# Patient Record
Sex: Female | Born: 1935 | Hispanic: No | Smoking: Former smoker
Health system: Southern US, Community
[De-identification: ages and names within clinical notes are randomized; demographics above are authoritative.]

## PROBLEM LIST (undated history)

## (undated) DIAGNOSIS — L309 Dermatitis, unspecified: Secondary | ICD-10-CM

## (undated) DIAGNOSIS — E039 Hypothyroidism, unspecified: Secondary | ICD-10-CM

## (undated) DIAGNOSIS — E559 Vitamin D deficiency, unspecified: Secondary | ICD-10-CM

## (undated) DIAGNOSIS — N200 Calculus of kidney: Secondary | ICD-10-CM

## (undated) DIAGNOSIS — H60542 Acute eczematoid otitis externa, left ear: Secondary | ICD-10-CM

## (undated) DIAGNOSIS — N179 Acute kidney failure, unspecified: Secondary | ICD-10-CM

## (undated) DIAGNOSIS — R569 Unspecified convulsions: Secondary | ICD-10-CM

## (undated) DIAGNOSIS — G4700 Insomnia, unspecified: Secondary | ICD-10-CM

## (undated) DIAGNOSIS — G629 Polyneuropathy, unspecified: Secondary | ICD-10-CM

## (undated) DIAGNOSIS — J309 Allergic rhinitis, unspecified: Secondary | ICD-10-CM

## (undated) DIAGNOSIS — M1990 Unspecified osteoarthritis, unspecified site: Secondary | ICD-10-CM

## (undated) DIAGNOSIS — I4891 Unspecified atrial fibrillation: Secondary | ICD-10-CM

## (undated) DIAGNOSIS — A6004 Herpesviral vulvovaginitis: Secondary | ICD-10-CM

## (undated) DIAGNOSIS — D649 Anemia, unspecified: Secondary | ICD-10-CM

## (undated) DIAGNOSIS — I10 Essential (primary) hypertension: Secondary | ICD-10-CM

## (undated) DIAGNOSIS — Z87442 Personal history of urinary calculi: Secondary | ICD-10-CM

## (undated) HISTORY — DX: Anemia, unspecified: D64.9

## (undated) HISTORY — DX: Insomnia, unspecified: G47.00

## (undated) HISTORY — DX: Acute kidney failure, unspecified: N17.9

## (undated) HISTORY — DX: Unspecified osteoarthritis, unspecified site: M19.90

## (undated) HISTORY — DX: Personal history of urinary calculi: Z87.442

## (undated) HISTORY — DX: Hypothyroidism, unspecified: E03.9

## (undated) HISTORY — DX: Polyneuropathy, unspecified: G62.9

## (undated) HISTORY — DX: Vitamin D deficiency, unspecified: E55.9

## (undated) HISTORY — PX: OTHER SURGICAL HISTORY: SHX169

## (undated) HISTORY — DX: Herpesviral vulvovaginitis: A60.04

## (undated) HISTORY — DX: Acute eczematoid otitis externa, left ear: H60.542

## (undated) HISTORY — DX: Dermatitis, unspecified: L30.9

## (undated) HISTORY — DX: Allergic rhinitis, unspecified: J30.9

---

## 2013-10-17 DIAGNOSIS — R569 Unspecified convulsions: Secondary | ICD-10-CM | POA: Insufficient documentation

## 2016-07-29 ENCOUNTER — Encounter: Payer: Self-pay | Admitting: Internal Medicine

## 2016-07-29 ENCOUNTER — Observation Stay
Admission: EM | Admit: 2016-07-29 | Discharge: 2016-07-30 | Disposition: A | Discharge status: Home / Self Care | Payer: Medicare Other | Attending: Specialist | Admitting: Specialist

## 2016-07-29 ENCOUNTER — Emergency Department: Payer: Medicare Other

## 2016-07-29 DIAGNOSIS — R7989 Other specified abnormal findings of blood chemistry: Secondary | ICD-10-CM

## 2016-07-29 DIAGNOSIS — R079 Chest pain, unspecified: Principal | ICD-10-CM | POA: Diagnosis present

## 2016-07-29 DIAGNOSIS — G629 Polyneuropathy, unspecified: Secondary | ICD-10-CM | POA: Insufficient documentation

## 2016-07-29 DIAGNOSIS — G40909 Epilepsy, unspecified, not intractable, without status epilepticus: Secondary | ICD-10-CM | POA: Insufficient documentation

## 2016-07-29 DIAGNOSIS — Z87442 Personal history of urinary calculi: Secondary | ICD-10-CM | POA: Insufficient documentation

## 2016-07-29 DIAGNOSIS — I69392 Facial weakness following cerebral infarction: Secondary | ICD-10-CM | POA: Insufficient documentation

## 2016-07-29 DIAGNOSIS — E039 Hypothyroidism, unspecified: Secondary | ICD-10-CM | POA: Insufficient documentation

## 2016-07-29 DIAGNOSIS — I5031 Acute diastolic (congestive) heart failure: Secondary | ICD-10-CM | POA: Insufficient documentation

## 2016-07-29 DIAGNOSIS — N179 Acute kidney failure, unspecified: Secondary | ICD-10-CM | POA: Insufficient documentation

## 2016-07-29 DIAGNOSIS — Z79899 Other long term (current) drug therapy: Secondary | ICD-10-CM | POA: Insufficient documentation

## 2016-07-29 DIAGNOSIS — R9431 Abnormal electrocardiogram [ECG] [EKG]: Secondary | ICD-10-CM | POA: Diagnosis not present

## 2016-07-29 DIAGNOSIS — I4891 Unspecified atrial fibrillation: Secondary | ICD-10-CM | POA: Insufficient documentation

## 2016-07-29 DIAGNOSIS — I200 Unstable angina: Secondary | ICD-10-CM

## 2016-07-29 DIAGNOSIS — R778 Other specified abnormalities of plasma proteins: Secondary | ICD-10-CM | POA: Diagnosis not present

## 2016-07-29 DIAGNOSIS — I110 Hypertensive heart disease with heart failure: Secondary | ICD-10-CM | POA: Diagnosis not present

## 2016-07-29 DIAGNOSIS — Z87891 Personal history of nicotine dependence: Secondary | ICD-10-CM | POA: Insufficient documentation

## 2016-07-29 HISTORY — DX: Calculus of kidney: N20.0

## 2016-07-29 HISTORY — DX: Unspecified atrial fibrillation: I48.91

## 2016-07-29 HISTORY — DX: Unspecified convulsions: R56.9

## 2016-07-29 HISTORY — DX: Acute kidney failure, unspecified: N17.9

## 2016-07-29 LAB — CBC WITH DIFFERENTIAL/PLATELET
Basophils Absolute: 0 10*3/uL (ref 0–0.1)
Basophils Relative: 1 %
EOS ABS: 0 10*3/uL (ref 0–0.7)
EOS PCT: 1 %
HCT: 31.1 % — ABNORMAL LOW (ref 35.0–47.0)
Hemoglobin: 10.2 g/dL — ABNORMAL LOW (ref 12.0–16.0)
LYMPHS ABS: 0.7 10*3/uL — AB (ref 1.0–3.6)
Lymphocytes Relative: 19 %
MCH: 30.9 pg (ref 26.0–34.0)
MCHC: 32.9 g/dL (ref 32.0–36.0)
MCV: 94 fL (ref 80.0–100.0)
MONO ABS: 0.4 10*3/uL (ref 0.2–0.9)
Monocytes Relative: 10 %
Neutro Abs: 2.7 10*3/uL (ref 1.4–6.5)
Neutrophils Relative %: 69 %
PLATELETS: 92 10*3/uL — AB (ref 150–440)
RBC: 3.31 MIL/uL — AB (ref 3.80–5.20)
RDW: 17.9 % — AB (ref 11.5–14.5)
WBC: 3.8 10*3/uL (ref 3.6–11.0)

## 2016-07-29 LAB — COMPREHENSIVE METABOLIC PANEL
ALK PHOS: 55 U/L (ref 38–126)
ALT: 51 U/L (ref 14–54)
AST: 44 U/L — ABNORMAL HIGH (ref 15–41)
Albumin: 3.6 g/dL (ref 3.5–5.0)
Anion gap: 10 (ref 5–15)
BUN: 17 mg/dL (ref 6–20)
CALCIUM: 8.9 mg/dL (ref 8.9–10.3)
CO2: 24 mmol/L (ref 22–32)
CREATININE: 0.72 mg/dL (ref 0.44–1.00)
Chloride: 104 mmol/L (ref 101–111)
Glucose, Bld: 140 mg/dL — ABNORMAL HIGH (ref 65–99)
Potassium: 3.5 mmol/L (ref 3.5–5.1)
Sodium: 138 mmol/L (ref 135–145)
Total Bilirubin: 1.2 mg/dL (ref 0.3–1.2)
Total Protein: 7.1 g/dL (ref 6.5–8.1)

## 2016-07-29 LAB — TROPONIN I
TROPONIN I: 0.04 ng/mL — AB (ref <0.03)
TROPONIN I: 0.05 ng/mL — AB (ref <0.03)

## 2016-07-29 MED ORDER — HYDRALAZINE HCL 20 MG/ML IJ SOLN
10.0000 mg | Freq: Once | INTRAMUSCULAR | Status: AC
  Administered 2016-07-29: 10 mg via INTRAVENOUS

## 2016-07-29 MED ORDER — NITROGLYCERIN 2 % TD OINT
TOPICAL_OINTMENT | TRANSDERMAL | Status: AC
  Administered 2016-07-29: 0.5 [in_us] via TOPICAL
  Filled 2016-07-29: qty 1

## 2016-07-29 MED ORDER — ACETAMINOPHEN 325 MG PO TABS: 650.0000 mg | ORAL_TABLET | Freq: Four times a day (QID) | ORAL | Status: DC | PRN

## 2016-07-29 MED ORDER — NITROGLYCERIN 2 % TD OINT
0.5000 [in_us] | TOPICAL_OINTMENT | Freq: Once | TRANSDERMAL | Status: AC
  Administered 2016-07-29: 0.5 [in_us] via TOPICAL

## 2016-07-29 MED ORDER — FUROSEMIDE 10 MG/ML IJ SOLN
INTRAMUSCULAR | Status: AC
  Administered 2016-07-29: 10 mg via INTRAVENOUS
  Filled 2016-07-29: qty 4

## 2016-07-29 MED ORDER — ASPIRIN 81 MG PO CHEW
324.0000 mg | CHEWABLE_TABLET | Freq: Once | ORAL | Status: AC
  Administered 2016-07-29: 324 mg via ORAL
  Filled 2016-07-29: qty 4

## 2016-07-29 MED ORDER — IBUPROFEN 400 MG PO TABS: 400.0000 mg | ORAL_TABLET | Freq: Four times a day (QID) | ORAL | Status: DC | PRN

## 2016-07-29 MED ORDER — LOSARTAN POTASSIUM 50 MG PO TABS
50.0000 mg | ORAL_TABLET | Freq: Every day | ORAL | Status: DC
  Administered 2016-07-30: 50 mg via ORAL
  Filled 2016-07-29: qty 1

## 2016-07-29 MED ORDER — SODIUM CHLORIDE 0.9% FLUSH: 3.0000 mL | INTRAVENOUS | Status: DC | PRN

## 2016-07-29 MED ORDER — ACETAMINOPHEN 650 MG RE SUPP: 650.0000 mg | Freq: Four times a day (QID) | RECTAL | Status: DC | PRN

## 2016-07-29 MED ORDER — HYDRALAZINE HCL 20 MG/ML IJ SOLN
INTRAMUSCULAR | Status: AC
  Administered 2016-07-29: 10 mg via INTRAVENOUS
  Filled 2016-07-29: qty 1

## 2016-07-29 MED ORDER — FUROSEMIDE 10 MG/ML IJ SOLN
20.0000 mg | Freq: Two times a day (BID) | INTRAMUSCULAR | Status: DC
  Administered 2016-07-29 – 2016-07-30 (×2): 20 mg via INTRAVENOUS
  Filled 2016-07-29 (×2): qty 2

## 2016-07-29 MED ORDER — ENOXAPARIN SODIUM 40 MG/0.4ML ~~LOC~~ SOLN
40.0000 mg | SUBCUTANEOUS | Status: DC
  Administered 2016-07-29: 40 mg via SUBCUTANEOUS
  Filled 2016-07-29: qty 0.4

## 2016-07-29 MED ORDER — GABAPENTIN 100 MG PO CAPS
100.0000 mg | ORAL_CAPSULE | Freq: Two times a day (BID) | ORAL | Status: DC
Start: 2016-07-29 — End: 2016-07-30
  Administered 2016-07-29 – 2016-07-30 (×2): 100 mg via ORAL
  Filled 2016-07-29 (×2): qty 1

## 2016-07-29 MED ORDER — HYDRALAZINE HCL 20 MG/ML IJ SOLN: 10.0000 mg | Freq: Four times a day (QID) | INTRAMUSCULAR | Status: DC | PRN

## 2016-07-29 MED ORDER — ASPIRIN EC 81 MG PO TBEC
81.0000 mg | DELAYED_RELEASE_TABLET | Freq: Every day | ORAL | Status: DC
  Administered 2016-07-30: 81 mg via ORAL
  Filled 2016-07-29: qty 1

## 2016-07-29 MED ORDER — LEVETIRACETAM 500 MG PO TABS
500.0000 mg | ORAL_TABLET | Freq: Two times a day (BID) | ORAL | Status: DC
  Administered 2016-07-29 – 2016-07-30 (×2): 500 mg via ORAL
  Filled 2016-07-29 (×2): qty 1

## 2016-07-29 MED ORDER — SODIUM CHLORIDE 0.9 % IV SOLN: 250.0000 mL | INTRAVENOUS | Status: DC | PRN

## 2016-07-29 MED ORDER — FUROSEMIDE 10 MG/ML IJ SOLN
10.0000 mg | Freq: Once | INTRAMUSCULAR | Status: AC
  Administered 2016-07-29: 10 mg via INTRAVENOUS

## 2016-07-29 MED ORDER — LEVOTHYROXINE SODIUM 25 MCG PO TABS
25.0000 ug | ORAL_TABLET | Freq: Every day | ORAL | Status: DC
  Administered 2016-07-30: 25 ug via ORAL
  Filled 2016-07-29: qty 1

## 2016-07-29 MED ORDER — SODIUM CHLORIDE 0.9% FLUSH
3.0000 mL | Freq: Two times a day (BID) | INTRAVENOUS | Status: DC
  Administered 2016-07-29 (×2): 3 mL via INTRAVENOUS

## 2016-07-29 MED ORDER — SODIUM CHLORIDE 0.9% FLUSH
3.0000 mL | Freq: Two times a day (BID) | INTRAVENOUS | Status: DC
  Administered 2016-07-29 – 2016-07-30 (×2): 3 mL via INTRAVENOUS

## 2016-07-29 NOTE — ED Provider Notes (Signed)
[facility] Emergency Department Provider Note   ____________________________________________   First MD Initiated Contact with Patient 07/29/16 1044     (approximate)  I have reviewed the triage vital signs and the nursing notes.   HISTORY  Chief Complaint Chest Pain and Nausea    HPI Stefanie [name] is a 82 y.o. female who is very difficult to understand but who says that for last night or 2 she's been having pain under her breasts with some sweating and nausea and shortness of breath. Was worse this morning when she got up. Seemed to be better if she laid on her left side. There is no pain at present   No past medical history on file. Hypertension (RAF-HCC)    Hyperlipidemia (RAF-HCC)    Stroke (RAF-HCC) 2012 residual left facial droop, left hemiparesis?  Reactive airway disease (RAF-HCC)    Seasonal allergies     AKI (acute kidney injury) , unspecified (CMS-HCC) 05/14/2016  Left nephrolithiasis 05/14/2016  Acute diarrhea, unspecified 05/14/2016  Afib , unspecified (CMS-HCC) 05/14/2016  Nephrolithiasis 05/14/2016  Acute renal failure , unspecified (CMS-HCC) 05/14/2016  Headache 03/14/2015  Seizures (CMS-HCC) 04/29/2014  Extremity pain     There are no active problems to display for this patient.   No past surgical history on file. Naproxen Anaphylaxis High 05/14/2016   Ace Inhibitors        Prior to Admission medications   Not on File    Allergies Review of patient's allergies indicates no known allergies.  No family history on file.  Social History Social History  Substance Use Topics  . Smoking status: Not on file  . Smokeless tobacco: Not on file  . Alcohol use Not on file    Review of Systems Constitutional: No fever/chills Eyes: No visual changes. ENT: No sore throat. Cardiovascular: Denies chest pain at present. Respiratory: Denies shortness of breath at present. Gastrointestinal: No abdominal pain at  present.  No nausea, no vomiting.  No diarrhea.  No constipation. Genitourinary: Negative for dysuria. Musculoskeletal: Negative for back pain. Skin: Negative for rash.  10-point ROS otherwise negative.  ____________________________________________   PHYSICAL EXAM:  VITAL SIGNS: ED Triage Vitals  Enc Vitals Group     BP 07/29/16 1025 (!) 172/83     Pulse Rate 07/29/16 1025 80     Resp 07/29/16 1025 18     Temp 07/29/16 1025 97.9 F (36.6 C)     Temp Source 07/29/16 1025 Oral     SpO2 07/29/16 1025 98 %     Weight 07/29/16 1027 148 lb (67.1 kg)     Height 07/29/16 1027 5\' 2"  (1.575 m)     Head Circumference --      Peak Flow --      Pain Score --      Pain Loc --      Pain Edu? --      Excl. in GC? --     Constitutional: Alert and oriented. Well appearing and in no acute distress. Eyes: Conjunctivae are normal. PERRL. EOMI. Head: Atraumatic. Nose: No congestion/rhinnorhea. Mouth/Throat: Mucous membranes are moist.  Oropharynx non-erythematous. Neck: No stridor.  ardiovascular: Normal rate, regular rhythm. Grossly normal heart sounds.  Good peripheral circulation. Respiratory: Normal respiratory effort.  No retractions. Lungs CTAB. Gastrointestinal: Soft and nontender. No distention. No abdominal bruits. No CVA tenderness. Musculoskeletal: No lower extremity tenderness nor edema.  No joint effusions. Neurologic:  Normal speech and language. No gross focal neurologic deficits are appreciated. No gait  instability.  ____________________________________________   LABS (all labs ordered are listed, but only abnormal results are displayed)  Labs Reviewed  COMPREHENSIVE METABOLIC PANEL - Abnormal; Notable for the following:       Result Value   Glucose, Bld 140 (*)    AST 44 (*)    All other components within normal limits  TROPONIN I - Abnormal; Notable for the following:    Troponin I 0.04 (*)    All other components within normal limits  CBC WITH  DIFFERENTIAL/PLATELET - Abnormal; Notable for the following:    RBC 3.31 (*)    Hemoglobin 10.2 (*)    HCT 31.1 (*)    RDW 17.9 (*)    Platelets 92 (*)    Lymphs Abs 0.7 (*)    All other components within normal limits   ____________________________________________  EKG  EKG read and interpreted by me shows atrial flutter at a rate of 89 with left axis nonspecific ST-T wave changes there are T-wave inversions in 1 aVL and V6 cannot find any old EKGs. ____________________________________________  RADIOLOGY  Study Result   CLINICAL DATA:  Chest pain, shortness of breath and nausea, 2 days duration.  EXAM: PORTABLE CHEST 1 VIEW  COMPARISON:  None.  FINDINGS: The heart is enlarged. There is aortic atherosclerosis. There is pulmonary venous hypertension. There is interstitial pulmonary edema. There is fluid in the fissures. Findings are most consistent with congestive heart failure. Basilar pneumonia not excluded, but the findings could be explained simply by congestive heart failure.  IMPRESSION: Cardiomegaly, aortic atherosclerosis, pulmonary venous hypertension, interstitial edema and fluid in the fissures most consistent with congestive heart failure. See above.   Electronically Signed   By: Paulina Fusi M.D.   On: 07/29/2016 11:10    ____________________________________________   PROCEDURES  Procedure(s) performed:   Procedures  Critical Care performed:   ____________________________________________   INITIAL IMPRESSION / ASSESSMENT AND PLAN / ED COURSE  Pertinent labs & imaging results that were available during my care of the patient were reviewed by me and considered in my medical decision making (see chart for details).    Clinical Course     ____________________________________________   FINAL CLINICAL IMPRESSION(S) / ED DIAGNOSES  Final diagnoses:  Unstable angina pectoris (HCC)  Elevated troponin  Abnormal EKG      NEW  MEDICATIONS STARTED DURING THIS VISIT:  New Prescriptions   No medications on file     Note:  This document was prepared using Dragon voice recognition software and may include unintentional dictation errors.    Arnaldo Natal, MD 07/29/16 [phone]

## 2016-07-29 NOTE — ED Triage Notes (Signed)
Pt presents to ED with reports of CP, SOB and nausea for two days.

## 2016-07-29 NOTE — Progress Notes (Signed)
Tele applied. Room air. Stress test will be 10/9. Pt reports some chest pain but doesn't want to take anything for it. A &O. Takes meds ok. Pt has no further concerns at this time.

## 2016-07-29 NOTE — H&P (Signed)
[facility] - [place] at [facility]   PATIENT NAME: Stefanie [name]    MR#:  [id number]  DATE OF BIRTH:  [birth year]  DATE OF ADMISSION:  07/29/2016  PRIMARY CARE PHYSICIAN: Arlyss Queen, MD   REQUESTING/REFERRING PHYSICIAN: Dorothea Glassman MD  CHIEF COMPLAINT:   Chief Complaint  Patient presents with  . Chest Pain  . Nausea    HISTORY OF PRESENT ILLNESS: Stefanie [name]  is a 82 y.o. female with a known history of Atrial fibrillation, seizure who is a very poor historian. Patient was in the hospital recently at [facility] with diarrhea and nausea and acute kidney injury. She was subsequently discharged home. She is presenting today with complaint of left-sided chest pain which she describes it as a pressure type of sensation. Worse with activity. She denies any shortness of breath or radiation of the pain. PAST MEDICAL HISTORY:   Past Medical History:  Diagnosis Date  . A-fib (HCC)   . AKI (acute kidney injury) (HCC)   . Left nephrolithiasis   . Seizure (HCC)     PAST SURGICAL HISTORY: Past Surgical History:  Procedure Laterality Date  . none      SOCIAL HISTORY:  Social History  Substance Use Topics  . Smoking status: Former Games developer  . Smokeless tobacco: Never Used  . Alcohol use No    FAMILY HISTORY:  Family History  Problem Relation Age of Onset  . Hypertension Mother     DRUG ALLERGIES: No Known Allergies  REVIEW OF SYSTEMS:   CONSTITUTIONAL: No fever, fatigue or weakness.  EYES: No blurred or double vision.  EARS, NOSE, AND THROAT: No tinnitus or ear pain.  RESPIRATORY: No cough, shortness of breath, wheezing or hemoptysis.  CARDIOVASCULAR: Positive chest pain, orthopnea, edema.  GASTROINTESTINAL: Positive nausea, vomiting, no diarrhea or abdominal pain.  GENITOURINARY: No dysuria, hematuria.  ENDOCRINE: No polyuria, nocturia,  HEMATOLOGY: No anemia, easy bruising or bleeding SKIN: No rash or lesion. MUSCULOSKELETAL: No  joint pain or arthritis.   NEUROLOGIC: No tingling, numbness, weakness.  PSYCHIATRY: No anxiety or depression.   MEDICATIONS AT HOME:  Prior to Admission medications   Medication Sig Start Date End Date Taking? Authorizing Provider  acetaminophen (TYLENOL) 325 MG tablet Take 650 mg by mouth every 6 (six) hours as needed.   Yes Historical Provider, MD  gabapentin (NEURONTIN) 100 MG capsule Take 100 mg by mouth 2 (two) times daily.   Yes Historical Provider, MD  levETIRAcetam (KEPPRA) 500 MG tablet Take 500 mg by mouth 2 (two) times daily.   Yes Historical Provider, MD  levothyroxine (SYNTHROID, LEVOTHROID) 25 MCG tablet Take 25 mcg by mouth daily before breakfast.   Yes Historical Provider, MD  losartan (COZAAR) 50 MG tablet Take 50 mg by mouth daily.   Yes Historical Provider, MD      PHYSICAL EXAMINATION:   VITAL SIGNS: Blood pressure (!) 172/83, pulse 80, temperature 97.9 F (36.6 C), temperature source Oral, resp. rate 18, height 5\' 2"  (1.575 m), weight 148 lb (67.1 kg), SpO2 98 %.  GENERAL:  82 y.o.-year-old patient lying in the bed with no acute distress.  EYES: Pupils equal, round, reactive to light and accommodation. No scleral icterus. Extraocular muscles intact.  HEENT: Head atraumatic, normocephalic. Oropharynx and nasopharynx clear.  NECK:  Supple, no jugular venous distention. No thyroid enlargement, no tenderness.  LUNGS: Normal breath sounds bilaterally, no wheezing, rales,rhonchi or crepitation. No use of accessory muscles of respiration.  CARDIOVASCULAR: S1, S2 normal. No murmurs,  rubs, or gallops.  ABDOMEN: Soft, nontender, nondistended. Bowel sounds present. No organomegaly or mass.  EXTREMITIES: No pedal edema, cyanosis, or clubbing.  NEUROLOGIC: Cranial nerves II through XII are intact. Muscle strength 5/5 in all extremities. Sensation intact. Gait not checked.  PSYCHIATRIC: The patient is alert and oriented x 3.  SKIN: No obvious rash, lesion, or ulcer.    LABORATORY PANEL:   CBC  Recent Labs Lab 07/29/16 1051  WBC 3.8  HGB 10.2*  HCT 31.1*  PLT 92*  MCV 94.0  MCH 30.9  MCHC 32.9  RDW 17.9*  LYMPHSABS 0.7*  MONOABS 0.4  EOSABS 0.0  BASOSABS 0.0   ------------------------------------------------------------------------------------------------------------------  Chemistries   Recent Labs Lab 07/29/16 1051  NA 138  K 3.5  CL 104  CO2 24  GLUCOSE 140*  BUN 17  CREATININE 0.72  CALCIUM 8.9  AST 44*  ALT 51  ALKPHOS 55  BILITOT 1.2   ------------------------------------------------------------------------------------------------------------------ estimated creatinine clearance is 46.2 mL/min (by C-G formula based on SCr of 0.72 mg/dL). ------------------------------------------------------------------------------------------------------------------ No results for input(s): TSH, T4TOTAL, T3FREE, THYROIDAB in the last 72 hours.  Invalid input(s): FREET3   Coagulation profile No results for input(s): INR, PROTIME in the last 168 hours. ------------------------------------------------------------------------------------------------------------------- No results for input(s): DDIMER in the last 72 hours. -------------------------------------------------------------------------------------------------------------------  Cardiac Enzymes  Recent Labs Lab 07/29/16 1051  TROPONINI 0.04*   ------------------------------------------------------------------------------------------------------------------ Invalid input(s): POCBNP  ---------------------------------------------------------------------------------------------------------------  Urinalysis No results found for: COLORURINE, APPEARANCEUR, LABSPEC, PHURINE, GLUCOSEU, HGBUR, BILIRUBINUR, KETONESUR, PROTEINUR, UROBILINOGEN, NITRITE, LEUKOCYTESUR   RADIOLOGY: Dg Chest Portable 1 View  Result Date: 07/29/2016 CLINICAL DATA:  Chest pain, shortness of breath  and nausea, 2 days duration. EXAM: PORTABLE CHEST 1 VIEW COMPARISON:  None. FINDINGS: The heart is enlarged. There is aortic atherosclerosis. There is pulmonary venous hypertension. There is interstitial pulmonary edema. There is fluid in the fissures. Findings are most consistent with congestive heart failure. Basilar pneumonia not excluded, but the findings could be explained simply by congestive heart failure. IMPRESSION: Cardiomegaly, aortic atherosclerosis, pulmonary venous hypertension, interstitial edema and fluid in the fissures most consistent with congestive heart failure. See above. Electronically Signed   By: Paulina Fusi M.D.   On: 07/29/2016 11:10    EKG: Orders placed or performed during the hospital encounter of 07/29/16  . EKG 12-Lead  . EKG 12-Lead    IMPRESSION AND PLAN: Patient is a 82 year old presenting with chest pain  1. Chest pain Troponin of 0.04 , will follow serial cardiac enzymes And start patient on aspirin We'll check stress test tomorrow.  2. Essential hypertension Tina losartan  3. Hypothyroidism continue Synthroid  4. Acute diastolic CHF based on her chest x-ray We'll treat patient with IV Lasix monitor renal function  5. Seizure disorder continue Keppra  6. Miscellaneous Lovenox for DVT prophylaxis  All the records are reviewed and case discussed with ED provider. Management plans discussed with the patient, family and they are in agreement.  CODE STATUS:    Code Status Orders        Start     Ordered   07/29/16 1258  Full code  Continuous     07/29/16 1259    Code Status History    Date Active Date Inactive Code Status Order ID Comments User Context   This patient has a current code status but no historical code status.       TOTAL TIME TAKING CARE OF THIS PATIENT: 55 minutes.    Auburn Bilberry M.D on 07/29/2016 at 1:28  PM  Between 7am to 6pm - Pager - [phone]  After 6pm go to [email or web address] - password [email or web address] [facility]  [facility]  [place] [facility]  Office  [phone]  CC: Primary care physician; Arlyss QueenSELVIDGE,WILLIAM M, MD

## 2016-07-29 NOTE — Care Management Obs Status (Signed)
MEDICARE OBSERVATION STATUS NOTIFICATION   Patient Details  Name: Stefanie [name] MRN: [id number] Date of Birth: 06-28-1929   Medicare Observation Status Notification Given:  Yes    Eber HongGreene, Jeaneane Adamec R, RN 07/29/2016, 3:05 PM

## 2016-07-30 ENCOUNTER — Observation Stay (HOSPITAL_BASED_OUTPATIENT_CLINIC_OR_DEPARTMENT_OTHER): Payer: Medicare Other

## 2016-07-30 DIAGNOSIS — R079 Chest pain, unspecified: Secondary | ICD-10-CM | POA: Diagnosis not present

## 2016-07-30 LAB — NM MYOCAR MULTI W/SPECT W/WALL MOTION / EF
CHL CUP NUCLEAR SDS: 3
CHL CUP STRESS STAGE 1 GRADE: 0 %
CHL CUP STRESS STAGE 1 HR: 76 {beats}/min
CHL CUP STRESS STAGE 3 GRADE: 0 %
CHL CUP STRESS STAGE 3 HR: 78 {beats}/min
CHL CUP STRESS STAGE 4 GRADE: 0 %
CHL CUP STRESS STAGE 4 HR: 77 {beats}/min
CHL CUP STRESS STAGE 4 SPEED: 0 mph
CHL CUP STRESS STAGE 5 HR: 85 {beats}/min
CHL CUP STRESS STAGE 6 DBP: 60 mmHg
CHL CUP STRESS STAGE 6 GRADE: 0 %
CHL CUP STRESS STAGE 6 SPEED: 0 mph
Estimated workload: 1 METS
LVDIAVOL: 88 mL (ref 46–106)
LVSYSVOL: 43 mL
Peak HR: 77 {beats}/min
Percent of predicted max HR: 57 %
Rest HR: 73 {beats}/min
SRS: 6
SSS: 8
Stage 1 Speed: 0 mph
Stage 2 Grade: 0 %
Stage 2 HR: 78 {beats}/min
Stage 2 Speed: 0 mph
Stage 3 Speed: 0 mph
Stage 5 Grade: 0 %
Stage 5 Speed: 0 mph
Stage 6 HR: 78 {beats}/min
Stage 6 SBP: 143 mmHg
TID: 1.06

## 2016-07-30 LAB — CBC
HEMATOCRIT: 29.9 % — AB (ref 35.0–47.0)
Hemoglobin: 10 g/dL — ABNORMAL LOW (ref 12.0–16.0)
MCH: 31.3 pg (ref 26.0–34.0)
MCHC: 33.5 g/dL (ref 32.0–36.0)
MCV: 93.2 fL (ref 80.0–100.0)
Platelets: 93 10*3/uL — ABNORMAL LOW (ref 150–440)
RBC: 3.21 MIL/uL — ABNORMAL LOW (ref 3.80–5.20)
RDW: 17.6 % — AB (ref 11.5–14.5)
WBC: 3 10*3/uL — AB (ref 3.6–11.0)

## 2016-07-30 LAB — BASIC METABOLIC PANEL
ANION GAP: 7 (ref 5–15)
BUN: 15 mg/dL (ref 6–20)
CALCIUM: 8.7 mg/dL — AB (ref 8.9–10.3)
CO2: 28 mmol/L (ref 22–32)
Chloride: 104 mmol/L (ref 101–111)
Creatinine, Ser: 0.61 mg/dL (ref 0.44–1.00)
GFR calc non Af Amer: >60 mL/min (ref >60)
Glucose, Bld: 90 mg/dL (ref 65–99)
POTASSIUM: 3.2 mmol/L — AB (ref 3.5–5.1)
Sodium: 139 mmol/L (ref 135–145)

## 2016-07-30 LAB — TROPONIN I: TROPONIN I: 0.05 ng/mL — AB (ref <0.03)

## 2016-07-30 MED ORDER — TECHNETIUM TC 99M TETROFOSMIN IV KIT
31.9540 | PACK | Freq: Once | INTRAVENOUS | Status: AC | PRN
  Administered 2016-07-30: 31.954 via INTRAVENOUS

## 2016-07-30 MED ORDER — REGADENOSON 0.4 MG/5ML IV SOLN
0.4000 mg | Freq: Once | INTRAVENOUS | Status: AC
  Administered 2016-07-30: 0.4 mg via INTRAVENOUS

## 2016-07-30 MED ORDER — TECHNETIUM TC 99M TETROFOSMIN IV KIT
12.0800 | PACK | Freq: Once | INTRAVENOUS | Status: AC | PRN
  Administered 2016-07-30: 12.08 via INTRAVENOUS

## 2016-07-30 NOTE — Discharge Summary (Signed)
[facility] - [place] at [facility]   PATIENT NAME: Stefanie [name]    MR#:  [id number]  DATE OF BIRTH:  [birth year]  DATE OF ADMISSION:  07/29/2016 ADMITTING PHYSICIAN: Auburn Bilberry, MD  DATE OF DISCHARGE: 07/30/2016  1:56 PM  PRIMARY CARE PHYSICIAN: Arlyss Queen, MD    ADMISSION DIAGNOSIS:  Unstable angina pectoris (HCC) [I20.0] Abnormal EKG [R94.31] Elevated troponin [R74.8] Chest pain [R07.9]  DISCHARGE DIAGNOSIS:  Active Problems:   Chest pain   SECONDARY DIAGNOSIS:   Past Medical History:  Diagnosis Date  . A-fib (HCC)   . AKI (acute kidney injury) (HCC)   . Left nephrolithiasis   . Seizure [facility])     HOSPITAL COURSE:   82 year old female with past medical history of atrial fibrillation, seizures who presented to the hospital due to chest pain with a mildly elevated troponin.  1. Chest pain with a mildly elevated troponin-patient was observed overnight on telemetry, her serial cardiac markers were trended which remained flat. -She underwent a nuclear medicine stress test which showed no evidence of acute myocardial ischemia. She is clinically asymptomatic with no acute chest pain and therefore not being discharged home.  2. Essential hypertension-patient will continue losartan.  3. History of seizures-patient had no acute seizures while in the hospital. She will continue her Keppra.  4. History of neuropathy-patient will continue her gabapentin.  DISCHARGE CONDITIONS:   Stable  CONSULTS OBTAINED:    DRUG ALLERGIES:  No Known Allergies  DISCHARGE MEDICATIONS:     Medication List    TAKE these medications   acetaminophen 325 MG tablet Commonly known as:  TYLENOL Take 650 mg by mouth every 6 (six) hours as needed.   gabapentin 100 MG capsule Commonly known as:  NEURONTIN Take 100 mg by mouth 2 (two) times daily.   levETIRAcetam 500 MG tablet Commonly known as:  KEPPRA Take 500 mg by mouth 2 (two) times daily.    levothyroxine 25 MCG tablet Commonly known as:  SYNTHROID, LEVOTHROID Take 25 mcg by mouth daily before breakfast.   losartan 50 MG tablet Commonly known as:  COZAAR Take 50 mg by mouth daily.         DISCHARGE INSTRUCTIONS:   DIET:  Cardiac diet  DISCHARGE CONDITION:  Stable  ACTIVITY:  Activity as tolerated  OXYGEN:  Home Oxygen: No.   Oxygen Delivery: room air  DISCHARGE LOCATION:  home   If you experience worsening of your admission symptoms, develop shortness of breath, life threatening emergency, suicidal or homicidal thoughts you must seek medical attention immediately by calling 911 or calling your MD immediately  if symptoms less severe.  You Must read complete instructions/literature along with all the possible adverse reactions/side effects for all the Medicines you take and that have been prescribed to you. Take any new Medicines after you have completely understood and accpet all the possible adverse reactions/side effects.   Please note  You were cared for by a hospitalist during your hospital stay. If you have any questions about your discharge medications or the care you received while you were in the hospital after you are discharged, you can call the unit and asked to speak with the hospitalist on call if the hospitalist that took care of you is not available. Once you are discharged, your primary care physician will handle any further medical issues. Please note that NO REFILLS for any discharge medications will be authorized once you are discharged, as it is imperative that you return to your  primary care physician (or establish a relationship with a primary care physician if you do not have one) for your aftercare needs so that they can reassess your need for medications and monitor your lab values.     Today   No acute chest pain, shortness of breath, nausea, vomiting.  VITAL SIGNS:  Blood pressure (!) 167/89, pulse 64, temperature 98.2 F (36.8  C), temperature source Oral, resp. rate 18, height 5\' 2"  (1.575 m), weight 66.2 kg (146 lb), SpO2 94 %.  I/O:   Intake/Output Summary (Last 24 hours) at 07/30/16 1513 Last data filed at 07/30/16 0210  Gross per 24 hour  Intake              243 ml  Output                0 ml  Net              243 ml    PHYSICAL EXAMINATION:  GENERAL:  82 y.o.-year-old patient lying in the bed with no acute distress.  EYES: Pupils equal, round, reactive to light and accommodation. No scleral icterus. Extraocular muscles intact.  HEENT: Head atraumatic, normocephalic. Oropharynx and nasopharynx clear.  NECK:  Supple, no jugular venous distention. No thyroid enlargement, no tenderness.  LUNGS: Normal breath sounds bilaterally, no wheezing, rales,rhonchi. No use of accessory muscles of respiration.  CARDIOVASCULAR: S1, S2 normal. No murmurs, rubs, or gallops.  ABDOMEN: Soft, non-tender, non-distended. Bowel sounds present. No organomegaly or mass.  EXTREMITIES: No pedal edema, cyanosis, or clubbing.  NEUROLOGIC: Cranial nerves II through XII are intact. No focal motor or sensory defecits b/l.  PSYCHIATRIC: The patient is alert and oriented x 3. Good affect.  SKIN: No obvious rash, lesion, or ulcer.   DATA REVIEW:   CBC  Recent Labs Lab 07/30/16 0600  WBC 3.0*  HGB 10.0*  HCT 29.9*  PLT 93*    Chemistries   Recent Labs Lab 07/29/16 1051 07/30/16 0600  NA 138 139  K 3.5 3.2*  CL 104 104  CO2 24 28  GLUCOSE 140* 90  BUN 17 15  CREATININE 0.72 0.61  CALCIUM 8.9 8.7*  AST 44*  --   ALT 51  --   ALKPHOS 55  --   BILITOT 1.2  --     Cardiac Enzymes  Recent Labs Lab 07/29/16 2331  TROPONINI 0.05*    Microbiology Results  No results found for this or any previous visit.  RADIOLOGY:  Dg Chest Portable 1 View  Result Date: 07/29/2016 CLINICAL DATA:  Chest pain, shortness of breath and nausea, 2 days duration. EXAM: PORTABLE CHEST 1 VIEW COMPARISON:  None. FINDINGS: The heart  is enlarged. There is aortic atherosclerosis. There is pulmonary venous hypertension. There is interstitial pulmonary edema. There is fluid in the fissures. Findings are most consistent with congestive heart failure. Basilar pneumonia not excluded, but the findings could be explained simply by congestive heart failure. IMPRESSION: Cardiomegaly, aortic atherosclerosis, pulmonary venous hypertension, interstitial edema and fluid in the fissures most consistent with congestive heart failure. See above. Electronically Signed   By: Paulina FusiMark  Shogry M.D.   On: 07/29/2016 11:10      Management plans discussed with the patient, family and they are in agreement.  CODE STATUS:     Code Status Orders        Start     Ordered   07/29/16 1258  Full code  Continuous     07/29/16 1259  Code Status History    Date Active Date Inactive Code Status Order ID Comments User Context   This patient has a current code status but no historical code status.      TOTAL TIME TAKING CARE OF THIS PATIENT: 40 minutes.    Houston Siren M.D on 07/30/2016 at 3:13 PM  Between 7am to 6pm - Pager - [phone]  After 6pm go to [email or web address] - Social research officer, government  [employer] [place] [facility]  Office  [phone]  CC: Primary care physician; Arlyss Queen, MD

## 2016-07-30 NOTE — Care Management (Signed)
Primary nurse verbalizes concerns as to whether patient has adequate support in the home. Patient resides with her daughter Benay Pillowmma Klonowski- [phone] at [address] [place].  Has access to a walker.  Independent in all adls, denies issues accessing medical care, obtaining medications or with transportation.  Current with her PCP.  No discharge needs identified at present by care manager or members of care team

## 2016-07-30 NOTE — Progress Notes (Signed)
Cardiology Alycia Rossettiyan PA telephoned this nurse notify stress test was normal attending physcian made aware

## 2016-07-30 NOTE — Progress Notes (Signed)
Discharge instructions given to son at bedside with teachback. IV access d/c pt. Denies c/o pain or discomfort.

## 2016-09-03 ENCOUNTER — Encounter: Payer: Self-pay | Admitting: Emergency Medicine

## 2016-09-03 ENCOUNTER — Emergency Department: Payer: Medicare Other

## 2016-09-03 ENCOUNTER — Emergency Department
Admission: EM | Admit: 2016-09-03 | Discharge: 2016-09-03 | Disposition: A | Discharge status: Home / Self Care | Payer: Medicare Other | Attending: Emergency Medicine | Admitting: Emergency Medicine

## 2016-09-03 DIAGNOSIS — M25562 Pain in left knee: Secondary | ICD-10-CM

## 2016-09-03 DIAGNOSIS — Z87891 Personal history of nicotine dependence: Secondary | ICD-10-CM | POA: Diagnosis not present

## 2016-09-03 DIAGNOSIS — G8929 Other chronic pain: Secondary | ICD-10-CM | POA: Diagnosis not present

## 2016-09-03 DIAGNOSIS — M170 Bilateral primary osteoarthritis of knee: Secondary | ICD-10-CM

## 2016-09-03 DIAGNOSIS — Z79899 Other long term (current) drug therapy: Secondary | ICD-10-CM | POA: Insufficient documentation

## 2016-09-03 DIAGNOSIS — L02211 Cutaneous abscess of abdominal wall: Secondary | ICD-10-CM

## 2016-09-03 DIAGNOSIS — M25561 Pain in right knee: Secondary | ICD-10-CM

## 2016-09-03 MED ORDER — CEPHALEXIN 500 MG PO CAPS: 500.0000 mg | ORAL_CAPSULE | Freq: Three times a day (TID) | ORAL | 0 refills | Status: DC

## 2016-09-03 MED ORDER — LIDOCAINE HCL (PF) 1 % IJ SOLN
5.0000 mL | Freq: Once | INTRAMUSCULAR | Status: DC
  Filled 2016-09-03: qty 5

## 2016-09-03 NOTE — Discharge Instructions (Signed)
Begin taking antibiotic 3 times a day for 7 days. Into taking Tylenol as to her to by your doctor for knee pain and also for your abscess pain. Change dressings as needed. [facility] to see if drainage can be removed in the office in 2 days. If [facility] or return to the emergency room.

## 2016-09-03 NOTE — ED Triage Notes (Signed)
Pt here with right leg pain that has been bothering her for a month now. Occasionally, pain in both legs.

## 2016-09-03 NOTE — ED Provider Notes (Signed)
[facility] Emergency Department Provider Note   ____________________________________________   First MD Initiated Contact with Patient 09/03/16 1016     (approximate)  I have reviewed the triage vital signs and the nursing notes.   HISTORY  Chief Complaint Leg Pain   HPI Stefanie [name] is a 82 y.o. female is here complaining of bilateral knee pain. Patient states that her knees been bothering her for approximately 1 month or more. She denies any history of injury and family agrees that patient has not fallen. Patient states that pain is increased with walking a few steps. She has been seen [place] who told her that she had arthritis. Patient states she just cannot believe that all her pain is caused by arthritis. She denies any fever, chills, increased leg pain or worsening. Patient states that her pain has remained the same. She states that her doctor has not placed her on any arthritis medication. Patient also has a lesion on her abdomen that she has been using ointment on. She states that this area does not appear to be improving. Patient rates her pain as a 10 over 10 with walking.   Past Medical History:  Diagnosis Date  . A-fib (HCC)   . AKI (acute kidney injury) (HCC)   . Left nephrolithiasis   . Seizure [facility](HCC)     Patient Active Problem List   Diagnosis Date Noted  . Chest pain 07/29/2016    Past Surgical History:  Procedure Laterality Date  . none      Prior to Admission medications   Medication Sig Start Date End Date Taking? Authorizing Provider  acetaminophen (TYLENOL) 325 MG tablet Take 650 mg by mouth every 6 (six) hours as needed.    Historical Provider, MD  cephALEXin (KEFLEX) 500 MG capsule Take 1 capsule (500 mg total) by mouth 3 (three) times daily. 09/03/16   Tommi Rumpshonda L Niki Payment, PA-C  gabapentin (NEURONTIN) 100 MG capsule Take 100 mg by mouth 2 (two) times daily.    Historical Provider, MD  levETIRAcetam (KEPPRA)  500 MG tablet Take 500 mg by mouth 2 (two) times daily.    Historical Provider, MD  levothyroxine (SYNTHROID, LEVOTHROID) 25 MCG tablet Take 25 mcg by mouth daily before breakfast.    Historical Provider, MD  losartan (COZAAR) 50 MG tablet Take 50 mg by mouth daily.    Historical Provider, MD    Allergies Patient has no known allergies.  Family History  Problem Relation Age of Onset  . Hypertension Mother     Social History Social History  Substance Use Topics  . Smoking status: Former Games developermoker  . Smokeless tobacco: Never Used  . Alcohol use No    Review of Systems Constitutional: No fever/chills Cardiovascular: Denies chest pain. Respiratory: Denies shortness of breath. Gastrointestinal: No abdominal pain.  No nausea, no vomiting.   Genitourinary: Negative for dysuria. Musculoskeletal: Positive for bilateral knee pain. Skin: Positive for lesion on abdomen. Neurological: Negative for headaches, focal weakness or numbness.  10-point ROS otherwise negative.  ____________________________________________   PHYSICAL EXAM:  VITAL SIGNS: ED Triage Vitals [09/03/16 0954]  Enc Vitals Group     BP (!) 178/77     Pulse Rate 70     Resp 18     Temp 97.5 F (36.4 C)     Temp Source Oral     SpO2 97 %     Weight 150 lb (68 kg)     Height 5\' 2"  (1.575  m)     Head Circumference      Peak Flow      Pain Score      Pain Loc      Pain Edu?      Excl. in GC?     Constitutional: Alert and oriented. Well appearing and in no acute distress. Head: Atraumatic. Nose: No congestion/rhinnorhea. Mouth/Throat: Mucous membranes are moist.  Oropharynx non-erythematous. Neck: No stridor.   Cardiovascular: Normal rate, regular rhythm. Grossly normal heart sounds.  Good peripheral circulation. Respiratory: Normal respiratory effort.  No retractions. Lungs CTAB. Gastrointestinal: Soft and nontender. No distention.  Musculoskeletal: On examination of bilateral knees there is obvious  degenerative changes noted in both. There is no effusion or soft tissue swelling. There is no erythema or warmth to the area. Range of motion is unrestricted however there is moderate crepitus with range of motion. Motor sensory function intact. Pulses are equal bilaterally. Neurologic:  Normal speech and language. No gross focal neurologic deficits are appreciated.  Skin:  Skin is warm, dry and intact. Abscess is noted to be abdomen. There is a fluctuant area in the center and moderate erythema. There is tenderness on palpation. No active drainage is noted due to skin being intact. Psychiatric: Mood and affect are normal. Speech and behavior are normal.  ____________________________________________   LABS (all labs ordered are listed, but only abnormal results are displayed)  Labs Reviewed - No data to display   RADIOLOGY X-ray of the right knee per radiologist: IMPRESSION:  1. No acute finding.  2. Severe osteoarthritis.  3. Osteopenia.     I, Tommi Rumps, personally viewed and evaluated these images (plain radiographs) as part of my medical decision making, as well as reviewing the written report by the radiologist.  ____________________________________________   PROCEDURES  Procedure(s) performed: INCISION AND DRAINAGE Performed by: Tommi Rumps Consent: Verbal consent obtained. Risks and benefits: risks, benefits and alternatives were discussed Type: abscess  Body area: Abdomen  Anesthesia: local infiltration  Incision was made with a scalpel.  Local anesthetic: lidocaine 1 % without epinephrine  Anesthetic total: 2 ml  Complexity: complex Blunt dissection to break up loculations  Drainage: purulent  Drainage amount: Moderate   Packing material: 1/4 in iodoform gauze  Patient tolerance: Patient tolerated the procedure well with no immediate complications.    Procedures  Critical Care performed:  No  ____________________________________________   INITIAL IMPRESSION / ASSESSMENT AND PLAN / ED COURSE  Pertinent labs & imaging results that were available during my care of the patient were reviewed by me and considered in my medical decision making (see chart for details).    Clinical Course    Patient went to x-ray department and had x-ray done of her right knee since this was the worst of the tube. She was shown x-rays of right knee explaining that she does have osteoarthritis. Patient also had procedure on her abdomen and abscess was opened. She tolerated this procedure extremely well. Patient and family are aware that she needs to follow-up with her Dr. Bernestine Amass for packing removal or return to the emergency room or no clinic for this. Family states that patient has an appointment with her Dr. Bernestine Amass next week. Patient was placed on Keflex and is to take Tylenol as needed for pain.  ____________________________________________   FINAL CLINICAL IMPRESSION(S) / ED DIAGNOSES  Final diagnoses:  Abscess of skin of abdomen  Chronic pain of both knees  Osteoarthritis of both knees, unspecified osteoarthritis  type      NEW MEDICATIONS STARTED DURING THIS VISIT:  Discharge Medication List as of 09/03/2016 11:38 AM    START taking these medications   Details  cephALEXin (KEFLEX) 500 MG capsule Take 1 capsule (500 mg total) by mouth 3 (three) times daily., Starting Mon 09/03/2016, Print         Note:  This document was prepared using Dragon voice recognition software and may include unintentional dictation errors.    Tommi Rumpshonda L Marilyn Wing, PA-C 09/03/16 1758    Sharman CheekPhillip Stafford, MD 09/04/16 2217

## 2016-09-03 NOTE — ED Triage Notes (Signed)
Family states pt is able to walk, but c/o pain in her right knee after walking a few steps.

## 2016-09-11 ENCOUNTER — Emergency Department: Payer: Medicare Other

## 2016-09-11 ENCOUNTER — Emergency Department
Admission: EM | Admit: 2016-09-11 | Discharge: 2016-09-11 | Disposition: A | Discharge status: Home / Self Care | Payer: Medicare Other | Attending: Emergency Medicine | Admitting: Emergency Medicine

## 2016-09-11 DIAGNOSIS — R350 Frequency of micturition: Secondary | ICD-10-CM | POA: Diagnosis present

## 2016-09-11 DIAGNOSIS — I10 Essential (primary) hypertension: Secondary | ICD-10-CM | POA: Diagnosis not present

## 2016-09-11 DIAGNOSIS — R4182 Altered mental status, unspecified: Secondary | ICD-10-CM | POA: Diagnosis not present

## 2016-09-11 DIAGNOSIS — R1084 Generalized abdominal pain: Secondary | ICD-10-CM | POA: Diagnosis not present

## 2016-09-11 DIAGNOSIS — Z87891 Personal history of nicotine dependence: Secondary | ICD-10-CM | POA: Diagnosis not present

## 2016-09-11 DIAGNOSIS — R531 Weakness: Secondary | ICD-10-CM

## 2016-09-11 DIAGNOSIS — Z79899 Other long term (current) drug therapy: Secondary | ICD-10-CM | POA: Diagnosis not present

## 2016-09-11 HISTORY — DX: Essential (primary) hypertension: I10

## 2016-09-11 LAB — COMPREHENSIVE METABOLIC PANEL
ALT: 14 U/L (ref 14–54)
ANION GAP: 6 (ref 5–15)
AST: 25 U/L (ref 15–41)
Albumin: 3.6 g/dL (ref 3.5–5.0)
Alkaline Phosphatase: 62 U/L (ref 38–126)
BUN: 18 mg/dL (ref 6–20)
CHLORIDE: 107 mmol/L (ref 101–111)
CO2: 28 mmol/L (ref 22–32)
Calcium: 9 mg/dL (ref 8.9–10.3)
Creatinine, Ser: 0.74 mg/dL (ref 0.44–1.00)
GFR calc non Af Amer: >60 mL/min (ref >60)
Glucose, Bld: 111 mg/dL — ABNORMAL HIGH (ref 65–99)
Potassium: 3.4 mmol/L — ABNORMAL LOW (ref 3.5–5.1)
SODIUM: 141 mmol/L (ref 135–145)
Total Bilirubin: 1.2 mg/dL (ref 0.3–1.2)
Total Protein: 7.3 g/dL (ref 6.5–8.1)

## 2016-09-11 LAB — CBC WITH DIFFERENTIAL/PLATELET
BASOS PCT: 1 %
Basophils Absolute: 0 10*3/uL (ref 0–0.1)
EOS ABS: 0 10*3/uL (ref 0–0.7)
EOS PCT: 1 %
HCT: 34.6 % — ABNORMAL LOW (ref 35.0–47.0)
Hemoglobin: 10.9 g/dL — ABNORMAL LOW (ref 12.0–16.0)
LYMPHS ABS: 0.6 10*3/uL — AB (ref 1.0–3.6)
Lymphocytes Relative: 18 %
MCH: 28.9 pg (ref 26.0–34.0)
MCHC: 31.5 g/dL — AB (ref 32.0–36.0)
MCV: 91.9 fL (ref 80.0–100.0)
MONOS PCT: 10 %
Monocytes Absolute: 0.3 10*3/uL (ref 0.2–0.9)
Neutro Abs: 2.3 10*3/uL (ref 1.4–6.5)
Neutrophils Relative %: 70 %
PLATELETS: 94 10*3/uL — AB (ref 150–440)
RBC: 3.76 MIL/uL — AB (ref 3.80–5.20)
RDW: 16.9 % — ABNORMAL HIGH (ref 11.5–14.5)
WBC: 3.3 10*3/uL — AB (ref 3.6–11.0)

## 2016-09-11 LAB — URINALYSIS COMPLETE WITH MICROSCOPIC (ARMC ONLY)
Bacteria, UA: NONE SEEN
Bilirubin Urine: NEGATIVE
Glucose, UA: NEGATIVE mg/dL
KETONES UR: NEGATIVE mg/dL
LEUKOCYTES UA: NEGATIVE
NITRITE: NEGATIVE
PH: 5 (ref 5.0–8.0)
PROTEIN: 100 mg/dL — AB
SPECIFIC GRAVITY, URINE: 1.024 (ref 1.005–1.030)

## 2016-09-11 LAB — LIPASE, BLOOD: Lipase: 16 U/L (ref 11–51)

## 2016-09-11 MED ORDER — FUROSEMIDE 10 MG/ML IJ SOLN
20.0000 mg | Freq: Once | INTRAMUSCULAR | Status: AC
  Administered 2016-09-11: 20 mg via INTRAVENOUS
  Filled 2016-09-11: qty 4

## 2016-09-11 MED ORDER — FUROSEMIDE 20 MG PO TABS: 40.0000 mg | ORAL_TABLET | Freq: Every day | ORAL | 0 refills | Status: DC

## 2016-09-11 MED ORDER — IOPAMIDOL (ISOVUE-300) INJECTION 61%
100.0000 mL | Freq: Once | INTRAVENOUS | Status: AC | PRN
  Administered 2016-09-11: 100 mL via INTRAVENOUS

## 2016-09-11 MED ORDER — IOPAMIDOL (ISOVUE-300) INJECTION 61%
30.0000 mL | Freq: Once | INTRAVENOUS | Status: AC
  Administered 2016-09-11: 30 mL via ORAL

## 2016-09-11 MED ORDER — SODIUM CHLORIDE 0.9 % IV BOLUS (SEPSIS)
500.0000 mL | Freq: Once | INTRAVENOUS | Status: AC
  Administered 2016-09-11: 500 mL via INTRAVENOUS

## 2016-09-11 NOTE — ED Notes (Signed)
Attempted to start iv x 2 unsuccessful

## 2016-09-11 NOTE — ED Provider Notes (Addendum)
Time Seen: Approximately 0 946  I have reviewed the triage notes  Chief Complaint: Urinary Frequency and Weakness   History of Present Illness: Stefanie [name] is a 82 y.o. female *who was here recently and also at her primary physician's office for increased generalized weakness. Apparently her legs "" give you periodically and she seems to be "" sleeping a lot "". Patient does not exhibit any focal neurologic signs and is been followed for a wound that was incised and drained. The family are very poor historians as is the patient's very difficult to ascertain with her difficulties at home. There has not been any focal complaints of headaches, chest pain, shortness of breath, etc.   Past Medical History:  Diagnosis Date  . A-fib (HCC)   . AKI (acute kidney injury) (HCC)   . Hypertension   . Left nephrolithiasis   . Seizure [facility](HCC)     Patient Active Problem List   Diagnosis Date Noted  . Chest pain 07/29/2016    Past Surgical History:  Procedure Laterality Date  . none      Past Surgical History:  Procedure Laterality Date  . none      Current Outpatient Rx  . Order #: [id number] Class: Historical Med  . Order #: [id number] Class: Print  . Order #: [id number] Class: Historical Med  . Order #: [id number] Class: Historical Med  . Order #: [id number] Class: Historical Med  . Order #: [id number] Class: Historical Med    Allergies:  Patient has no known allergies.  Family History: Family History  Problem Relation Age of Onset  . Hypertension Mother     Social History: Social History  Substance Use Topics  . Smoking status: Former Games developermoker  . Smokeless tobacco: Never Used  . Alcohol use No     Review of Systems:   10 point review of systems was performed and was otherwise negative:  Constitutional: No fever Eyes: No visual disturbances ENT: No sore throat, ear pain Cardiac: No chest pain Respiratory: No shortness of breath, wheezing, or stridor Abdomen: No abdominal  pain, no vomiting, No diarrhea Endocrine: No weight loss, No night sweats Extremities: No peripheral edema, cyanosis Skin: No rashes, easy bruising Neurologic: No focal weakness, trouble with speech or swollowing Urologic: Patient was recently diagnosed with urinary tract infection is been established on antibiotic therapy   Physical Exam:  ED Triage Vitals  Enc Vitals Group     BP 09/11/16 0937 (!) 160/85     Pulse Rate 09/11/16 0937 73     Resp 09/11/16 0937 20     Temp 09/11/16 0937 97.9 F (36.6 C)     Temp Source 09/11/16 0937 Oral     SpO2 09/11/16 0937 95 %     Weight 09/11/16 0938 170 lb (77.1 kg)     Height 09/11/16 0938 5\' 4"  (1.626 m)     Head Circumference --      Peak Flow --      Pain Score --      Pain Loc --      Pain Edu? --      Excl. in GC? --     General: Awake , Alert , and Oriented times 1. Patient does follow some commands. Somewhat lethargic Head: Normal cephalic , atraumatic Eyes: Left eye blindness (chronic). Right pupil appears to be reactive with extraocular eye movements intact Nose/Throat: No nasal drainage, patent upper airway without erythema or exudate. Dry mucous membranes Neck: Supple, Full range of motion, No anterior adenopathy or  palpable thyroid masses Lungs: Clear to ascultation without wheezes , rhonchi, or rales Heart: Regular rate, regular rhythm without murmurs , gallops , or rubs Abdomen: Soft, non tender without rebound, guarding , or rigidity; bowel sounds positive and symmetric in all 4 quadrants. No organomegaly .        Extremities: 2 plus symmetric pulses. No edema, clubbing or cyanosis Neurologic: nn, Motor symmetric without deficits, sensory intact. No focal neurologic deficits Skin: Well-healing small old abscess on the lower abdominal region without any erythema or induration.   Labs:   All laboratory work was reviewed including any pertinent negatives or positives listed below:  Labs Reviewed  CBC WITH  DIFFERENTIAL/PLATELET - Abnormal; Notable for the following:       Result Value   WBC 3.3 (*)    RBC 3.76 (*)    Hemoglobin 10.9 (*)    HCT 34.6 (*)    MCHC 31.5 (*)    RDW 16.9 (*)    Platelets 94 (*)    Lymphs Abs 0.6 (*)    All other components within normal limits  COMPREHENSIVE METABOLIC PANEL - Abnormal; Notable for the following:    Potassium 3.4 (*)    Glucose, Bld 111 (*)    All other components within normal limits  URINALYSIS COMPLETEWITH MICROSCOPIC ([facility] ONLY) - Abnormal; Notable for the following:    Color, Urine AMBER (*)    APPearance CLEAR (*)    Hgb urine dipstick 1+ (*)    Protein, ur 100 (*)    Squamous Epithelial / LPF 0-5 (*)    All other components within normal limits  LIPASE, BLOOD  Laboratory work was reviewed and showed no clinically significant abnormalities.   EKG:  ED ECG REPORT I, Jennye Moccasin, the attending physician, personally viewed and interpreted this ECG.  Date: 09/11/2016 EKG Time: 1605 Rate: *74 Rhythm: Atrial fibrillation QRS Axis: normal Intervals: normal ST/T Wave abnormalities: Nonspecific ST-T wave abnormalities Conduction Disturbances: none Narrative Interpretation: unremarkable Prolonged QT interval No acute ischemic changes    Radiology: "Dg Chest 2 View  Result Date: 09/11/2016 CLINICAL DATA:  Diffuse abdominal pain, weakness EXAM: CHEST  2 VIEW COMPARISON:  Chest x-ray of 07/29/2016 FINDINGS: The lungs are not well aerated. Prominent interstitial markings are present which may indicate mild interstitial edema. There is more opacity at the right lung base possibly reflecting atelectasis, but small effusion and/or developing pneumonia cannot be excluded. A rounded nodular opacity at the right lung base is not seen in review of the prior chest x-ray and this could be due to right pleural effusion and/or atelectasis. Cardiomegaly is stable. The bones are osteopenic IMPRESSION: 1. Prominent interstitial markings most  likely reflect mild interstitial edema with poor inspiration. 2. Fluoro passed at the right lung base may be due to atelectasis, effusion, or possibly developing pneumonia . Electronically Signed   By: Dwyane Dee M.D.   On: 09/11/2016 10:38   Ct Head Wo Contrast  Result Date: 09/11/2016 CLINICAL DATA:  Altered mental status, UTI, confusion, weakness, knees gave out EXAM: CT HEAD WITHOUT CONTRAST TECHNIQUE: Contiguous axial images were obtained from the base of the skull through the vertex without intravenous contrast. COMPARISON:  None FINDINGS: Brain: Generalized atrophy. Normal ventricular morphology. No midline shift or mass effect. Small vessel chronic ischemic changes of deep cerebral white matter. Old appearing frontal lobe infarcts bilaterally larger on LEFT. No intracranial hemorrhage, mass lesion, evidence of acute infarction, or extra-axial fluid collection. Calcifications in falx. Vascular: Atherosclerotic  calcification of internal carotid and vertebral arteries at skullbase. Prominent basilar tip 4.7 mm transverse. Skull: Intact but demineralized Sinuses/Orbits: Partially opacified RIGHT frontal sinus. Remaining visualized paranasal sinuses and mastoid air cells clear Other: N/A IMPRESSION: Atrophy with small vessel chronic ischemic changes of deep cerebral white matter. 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 No definite acute intracranial abnormalities. Prominent basilar tip 4.7 mm diameter with extensive atherosclerotic calcifications at skullbase. Electronically Signed   By: Ulyses SouthwardMark  Boles M.D.   On: 09/11/2016 15:46   Ct Abdomen Pelvis W Contrast  Result Date: 09/11/2016 CLINICAL DATA:  Diffuse abdominal pain, on antibiotics for UTI for over week but not getting better, confusion, weakness, draining abdominal wound, history atrial fibrillation, kidney stones, hypertension, former smoker EXAM: CT ABDOMEN AND PELVIS WITH CONTRAST TECHNIQUE: Multidetector CT imaging of the abdomen and pelvis was  performed using the standard protocol following bolus administration of intravenous contrast. Sagittal and coronal MPR images reconstructed from axial data set. CONTRAST:  100mL ISOVUE-300 IOPAMIDOL (ISOVUE-300) INJECTION 61% IV. Dilute oral contrast. COMPARISON:  None FINDINGS: Lower chest: Bibasilar pleural effusions and atelectasis RIGHT greater than LEFT. Pericardial effusion. Dilated cardiac chambers. Coronary arterial calcifications. Hepatobiliary: Liver normal appearance. Gallbladder wall appears thickened and slightly irregular, question edematous. Dependent slightly higher attenuation material within the gallbladder lumen could represent sludge or faintly calcified stones, acute cholecystitis not excluded. Pancreas: Normal appearance Spleen: Small hypervascular focus inferolateral spleen 9 mm diameter question atypical hemangioma. Otherwise normal appearance Adrenals/Urinary Tract: Thickening of adrenal glands without discrete mass. BILATERAL renal cysts, largest posterior upper pole RIGHT kidney 7.8 x 6.5 cm image 32. No hydronephrosis or definite urinary tract calcifications. RIGHT ureter and bladder unremarkable. Segmental dilatation of the distal LEFT ureter without calculus or hydronephrosis. Stomach/Bowel: Proximal stomach incompletely distended unable to exclude gastric wall thickening in this setting. Small bowel loops unremarkable. Stool in rectum. Ascending colon incompletely distended limiting assessment of wall thickness. Vascular/Lymphatic: Scattered normal sized mesenteric nodes. No definite adenopathy. Atherosclerotic calcifications aorta including origins of the celiac and superior mesenteric arteries as well as both renal arteries Reproductive: Atrophic uterus and ovaries. Other: Small amount of low-attenuation free pelvic fluid. Scattered mild subcutaneous edema, nonspecific. No free intraperitoneal air or hernia. Musculoskeletal: Diffuse osseous demineralization with age-indeterminate  superior endplate compression deformities of L2 and L3 vertebral bodies. IMPRESSION: Bibasilar pleural effusions atelectasis RIGHT greater than LEFT. Enlargement of cardiac chambers with a pericardial fusion. Slight thickening of gallbladder wall with dependent density which could represent sludge or faintly calcified stones; acute cholecystitis not excluded and assessment by ultrasound recommended. BILATERAL renal cysts. Segmental dilatation of the distal LEFT ureter of uncertain etiology, without definite obstructing calculus or LEFT hydronephrosis identified. Aortic atherosclerosis with calcified plaques and narrowing of the origins of the celiac artery, SMA and renal arteries. Small amount of nonspecific free intraperitoneal fluid. Electronically Signed   By: Ulyses SouthwardMark  Boles M.D.   On: 09/11/2016 13:12   Dg Knee Complete 4 Views Right  Result Date: 09/03/2016 CLINICAL DATA:  Knee pain for months with no known injury. EXAM: RIGHT KNEE - COMPLETE 4+ VIEW COMPARISON:  None. FINDINGS: No acute fracture, subluxation, or joint effusion. Severe tricompartmental osteoarthritis with generalized advanced joint narrowing and bulky spurring. Osteopenia and atherosclerosis. IMPRESSION: 1. No acute finding. 2. Severe osteoarthritis. 3. Osteopenia. Electronically Signed   By: Marnee SpringJonathon  Watts M.D.   On: 09/03/2016 11:24   [place] Abdomen Limited Ruq  Result Date: 09/11/2016 CLINICAL DATA:  Acute abdominal pain for 2 days EXAM: US ABDOMEN LIMITED -  RIGHT UPPER QUADRANT COMPARISON:  Abdominal CT from earlier today FINDINGS: Gallbladder: Cholelithiasis. Gallbladder wall is mildly thickened at 4 mm and there is pericholecystic fluid, but no focal tenderness. Given the degree of pleural effusions, cardiomegaly, and IVC distension with periportal edema findings may be secondary to passive congestion. Common bile duct: Diameter: 3 mm Liver: No focal lesion identified. Within normal limits in parenchymal echogenicity. IMPRESSION:  There is cholelithiasis, gallbladder wall thickening, pericholecystic fluid but no focal tenderness typical of acute cholecystitis. Given pleural effusions, cardiomegaly, an IVC distension, gallbladder wall findings are favored reactive to passive congestion/right heart failure. Electronically Signed   By: Marnee Spring M.D.   On: 09/11/2016 15:26  "  I personally reviewed the radiologic studies    ED Course:  Patient has had symptoms some cyclic breathing syndrome though her sats seemed to remain okay on room air. She still seems to be very listless and the only thing we can find on numerous objective studies that she has some mild pulmonary edema. Patient was given a dose of Lasix here in emergency department. She is likely developing some dementia and altered mental status at night with no obvious acute injury. I felt most of her issues could be at least evaluated on outpatient basis and the patient may be reaching a point where she requires nursing home assessment for nursing home placement. Clinical Course      Assessment:  Dyspnea   Final Clinical Impression: *  Final diagnoses:  Abdominal pain, acute, generalized     Plan:  Internal medicine evaluation           Jennye Moccasin, MD 09/11/16 1612    Jennye Moccasin, MD 09/11/16 4782    Jennye Moccasin, MD 09/11/16 [phone]

## 2016-09-11 NOTE — ED Notes (Signed)
Pt D/C to lobby in wheelchair with son awaiting ride home

## 2016-09-11 NOTE — ED Notes (Signed)
Gave report to Grace RN 

## 2016-09-11 NOTE — Discharge Instructions (Signed)
Extensive evaluation here show no reason to acutely admit the patient. There is no focal findings on head CT, chest x-ray, abdominal pelvic CT and right upper quadrant ultrasound. Laboratory work. Patient may have a slight exacerbation of pulmonary edema which is causing her respiratory issues but she is not hypoxic and I felt did not require inpatient treatment at this time. Follow-up should be arranged on an outpatient basis especially if there is any discussion about rehabilitation or placement.

## 2016-09-11 NOTE — ED Triage Notes (Signed)
Pt is here with family, states pt was seen here over a week ago with UTI states she is still on abx but is not getting any better, states she has been confused, weak, states she will be walking and suddenly her knees give out and she falls to the floor.. Also states she has a wound on the abdomen that had to drained, states they would like it checked..Marland Kitchen

## 2016-09-11 NOTE — ED Notes (Signed)
Pt to ed with family who c/o of being weak all over for several weeks.  Pt family reports recent infection in abd and I and D and drainage from that area.  Skin with redness around site in upper abd and greenish yellow discharge.  Pt family reports she is unable to ambulate without 2 person assistance.  Per family pt is often confused and disoriented and stays up at night and then sleeps during the day.  Pt alert but confused at this time. Per family pt with good po intake.  Pt with foul odor noted.

## 2016-09-25 ENCOUNTER — Encounter: Payer: Self-pay | Admitting: Emergency Medicine

## 2016-09-25 ENCOUNTER — Inpatient Hospital Stay
Admission: EM | Admit: 2016-09-25 | Discharge: 2016-09-27 | DRG: 684 | Disposition: A | Discharge status: Skilled Nursing Facility | Payer: Medicare Other | Attending: Internal Medicine | Admitting: Internal Medicine

## 2016-09-25 ENCOUNTER — Emergency Department: Payer: Medicare Other

## 2016-09-25 DIAGNOSIS — Z87442 Personal history of urinary calculi: Secondary | ICD-10-CM

## 2016-09-25 DIAGNOSIS — M6281 Muscle weakness (generalized): Secondary | ICD-10-CM

## 2016-09-25 DIAGNOSIS — N1330 Unspecified hydronephrosis: Secondary | ICD-10-CM | POA: Diagnosis present

## 2016-09-25 DIAGNOSIS — R4182 Altered mental status, unspecified: Secondary | ICD-10-CM

## 2016-09-25 DIAGNOSIS — N179 Acute kidney failure, unspecified: Secondary | ICD-10-CM | POA: Diagnosis present

## 2016-09-25 DIAGNOSIS — Z79899 Other long term (current) drug therapy: Secondary | ICD-10-CM | POA: Diagnosis not present

## 2016-09-25 DIAGNOSIS — R262 Difficulty in walking, not elsewhere classified: Secondary | ICD-10-CM

## 2016-09-25 DIAGNOSIS — Z87891 Personal history of nicotine dependence: Secondary | ICD-10-CM

## 2016-09-25 DIAGNOSIS — G40909 Epilepsy, unspecified, not intractable, without status epilepticus: Secondary | ICD-10-CM | POA: Diagnosis present

## 2016-09-25 DIAGNOSIS — Z515 Encounter for palliative care: Secondary | ICD-10-CM

## 2016-09-25 DIAGNOSIS — G309 Alzheimer's disease, unspecified: Secondary | ICD-10-CM | POA: Diagnosis present

## 2016-09-25 DIAGNOSIS — I50810 Right heart failure, unspecified: Secondary | ICD-10-CM | POA: Diagnosis present

## 2016-09-25 DIAGNOSIS — Z8249 Family history of ischemic heart disease and other diseases of the circulatory system: Secondary | ICD-10-CM | POA: Diagnosis not present

## 2016-09-25 DIAGNOSIS — R778 Other specified abnormalities of plasma proteins: Secondary | ICD-10-CM

## 2016-09-25 DIAGNOSIS — F0280 Dementia in other diseases classified elsewhere without behavioral disturbance: Secondary | ICD-10-CM | POA: Diagnosis present

## 2016-09-25 DIAGNOSIS — R531 Weakness: Secondary | ICD-10-CM

## 2016-09-25 DIAGNOSIS — I110 Hypertensive heart disease with heart failure: Secondary | ICD-10-CM | POA: Diagnosis present

## 2016-09-25 DIAGNOSIS — E039 Hypothyroidism, unspecified: Secondary | ICD-10-CM | POA: Diagnosis present

## 2016-09-25 DIAGNOSIS — Z7189 Other specified counseling: Secondary | ICD-10-CM

## 2016-09-25 DIAGNOSIS — Z87440 Personal history of urinary (tract) infections: Secondary | ICD-10-CM

## 2016-09-25 DIAGNOSIS — R7989 Other specified abnormal findings of blood chemistry: Secondary | ICD-10-CM

## 2016-09-25 DIAGNOSIS — E860 Dehydration: Secondary | ICD-10-CM | POA: Diagnosis present

## 2016-09-25 DIAGNOSIS — I482 Chronic atrial fibrillation: Secondary | ICD-10-CM | POA: Diagnosis present

## 2016-09-25 LAB — CBC WITH DIFFERENTIAL/PLATELET
BASOS ABS: 0 10*3/uL (ref 0–0.1)
Basophils Relative: 0 %
Eosinophils Absolute: 0 10*3/uL (ref 0–0.7)
Eosinophils Relative: 1 %
HEMATOCRIT: 37.4 % (ref 35.0–47.0)
Hemoglobin: 12.2 g/dL (ref 12.0–16.0)
LYMPHS PCT: 11 %
Lymphs Abs: 0.6 10*3/uL — ABNORMAL LOW (ref 1.0–3.6)
MCH: 28.6 pg (ref 26.0–34.0)
MCHC: 32.5 g/dL (ref 32.0–36.0)
MCV: 88.1 fL (ref 80.0–100.0)
MONO ABS: 0.7 10*3/uL (ref 0.2–0.9)
Monocytes Relative: 13 %
NEUTROS ABS: 3.9 10*3/uL (ref 1.4–6.5)
Neutrophils Relative %: 75 %
Platelets: 81 10*3/uL — ABNORMAL LOW (ref 150–440)
RBC: 4.25 MIL/uL (ref 3.80–5.20)
RDW: 16.2 % — AB (ref 11.5–14.5)
WBC: 5.2 10*3/uL (ref 3.6–11.0)

## 2016-09-25 LAB — URINALYSIS, COMPLETE (UACMP) WITH MICROSCOPIC
BILIRUBIN URINE: NEGATIVE
Glucose, UA: NEGATIVE mg/dL
Ketones, ur: NEGATIVE mg/dL
Leukocytes, UA: NEGATIVE
Nitrite: NEGATIVE
PROTEIN: NEGATIVE mg/dL
Specific Gravity, Urine: 1.017 (ref 1.005–1.030)
pH: 5 (ref 5.0–8.0)

## 2016-09-25 LAB — COMPREHENSIVE METABOLIC PANEL
ALBUMIN: 3.3 g/dL — AB (ref 3.5–5.0)
ALT: 11 U/L — ABNORMAL LOW (ref 14–54)
AST: 30 U/L (ref 15–41)
Alkaline Phosphatase: 66 U/L (ref 38–126)
Anion gap: 10 (ref 5–15)
BUN: 53 mg/dL — AB (ref 6–20)
CHLORIDE: 103 mmol/L (ref 101–111)
CO2: 26 mmol/L (ref 22–32)
Calcium: 9.2 mg/dL (ref 8.9–10.3)
Creatinine, Ser: 1.6 mg/dL — ABNORMAL HIGH (ref 0.44–1.00)
GFR calc Af Amer: 33 mL/min — ABNORMAL LOW (ref >60)
GFR, EST NON AFRICAN AMERICAN: 28 mL/min — AB (ref >60)
GLUCOSE: 103 mg/dL — AB (ref 65–99)
POTASSIUM: 4.5 mmol/L (ref 3.5–5.1)
SODIUM: 139 mmol/L (ref 135–145)
Total Bilirubin: 1.3 mg/dL — ABNORMAL HIGH (ref 0.3–1.2)
Total Protein: 8 g/dL (ref 6.5–8.1)

## 2016-09-25 LAB — TSH: TSH: 8.896 u[IU]/mL — AB (ref 0.350–4.500)

## 2016-09-25 LAB — LACTIC ACID, PLASMA: LACTIC ACID, VENOUS: 1.3 mmol/L (ref 0.5–1.9)

## 2016-09-25 LAB — TROPONIN I: TROPONIN I: 0.06 ng/mL — AB (ref <0.03)

## 2016-09-25 MED ORDER — SODIUM CHLORIDE 0.9 % IV BOLUS (SEPSIS)
1000.0000 mL | Freq: Once | INTRAVENOUS | Status: AC
  Administered 2016-09-25: 1000 mL via INTRAVENOUS

## 2016-09-25 MED ORDER — HEPARIN SODIUM (PORCINE) 5000 UNIT/ML IJ SOLN
5000.0000 [IU] | Freq: Three times a day (TID) | INTRAMUSCULAR | Status: DC
  Administered 2016-09-25 – 2016-09-27 (×5): 5000 [IU] via SUBCUTANEOUS
  Filled 2016-09-25 (×5): qty 1

## 2016-09-25 MED ORDER — VANCOMYCIN HCL IN DEXTROSE 1-5 GM/200ML-% IV SOLN
1000.0000 mg | Freq: Once | INTRAVENOUS | Status: AC
Start: 2016-09-25 — End: 2016-09-25
  Administered 2016-09-25: 1000 mg via INTRAVENOUS
  Filled 2016-09-25: qty 200

## 2016-09-25 MED ORDER — SODIUM CHLORIDE 0.9% FLUSH
3.0000 mL | Freq: Two times a day (BID) | INTRAVENOUS | Status: DC
  Administered 2016-09-25 – 2016-09-27 (×4): 3 mL via INTRAVENOUS

## 2016-09-25 MED ORDER — LEVETIRACETAM 500 MG PO TABS
500.0000 mg | ORAL_TABLET | Freq: Two times a day (BID) | ORAL | Status: DC
  Administered 2016-09-25 – 2016-09-27 (×4): 500 mg via ORAL
  Filled 2016-09-25 (×4): qty 1

## 2016-09-25 MED ORDER — SODIUM CHLORIDE 0.9 % IV SOLN
INTRAVENOUS | Status: DC
  Administered 2016-09-25 – 2016-09-26 (×2): via INTRAVENOUS

## 2016-09-25 MED ORDER — LEVOTHYROXINE SODIUM 25 MCG PO TABS
25.0000 ug | ORAL_TABLET | Freq: Every day | ORAL | Status: DC
  Administered 2016-09-26 – 2016-09-27 (×2): 25 ug via ORAL
  Filled 2016-09-25 (×2): qty 1

## 2016-09-25 MED ORDER — PIPERACILLIN-TAZOBACTAM 3.375 G IVPB
3.3750 g | Freq: Once | INTRAVENOUS | Status: AC
  Administered 2016-09-25: 3.375 g via INTRAVENOUS
  Filled 2016-09-25: qty 50

## 2016-09-25 MED ORDER — SODIUM CHLORIDE 0.9 % IV BOLUS (SEPSIS)
500.0000 mL | Freq: Once | INTRAVENOUS | Status: AC
  Administered 2016-09-25: 500 mL via INTRAVENOUS

## 2016-09-25 NOTE — H&P (Signed)
[facility] [place] at [facility]   PATIENT NAME: Stefanie [name]    MR#:  [id number]  DATE OF BIRTH:  [birth year]  DATE OF ADMISSION:  09/25/2016  PRIMARY CARE PHYSICIAN: Arlyss QueenSELVIDGE,WILLIAM M, MD   REQUESTING/REFERRING PHYSICIAN: william  CHIEF COMPLAINT:   Chief Complaint  Patient presents with  . Altered Mental Status    HISTORY OF PRESENT ILLNESS: Stefanie Haberattie Krabbenhoft  is a 82 y.o. female with a known history of Atrial fibrillation, hypertension, hydronephrosis, seizures, hypothyroidism- brought to emergency room because of lethargic and generalized weakness. History obtained from ER physician's chart is there is no related to present in the room currently and the number for emergency contact in the computerized chart is with the son who has distanced himself from her for long time so we do not know much about the patient. Patient is lethargic and not able to give me any details. Her workup for infection came to be negative in ER including CT scan of the head. But she is noted to have acute renal failure compared to her last visit in emergency room which was 2 weeks ago.  PAST MEDICAL HISTORY:   Past Medical History:  Diagnosis Date  . A-fib (HCC)   . AKI (acute kidney injury) (HCC)   . Hypertension   . Left nephrolithiasis   . Seizure (HCC)     PAST SURGICAL HISTORY: Past Surgical History:  Procedure Laterality Date  . none      SOCIAL HISTORY:  Social History  Substance Use Topics  . Smoking status: Former Games developermoker  . Smokeless tobacco: Never Used  . Alcohol use No    FAMILY HISTORY:  Family History  Problem Relation Age of Onset  . Hypertension Mother     DRUG ALLERGIES: No Known Allergies  REVIEW OF SYSTEMS:   Patient is lethargic and not able to give a review of system.  MEDICATIONS AT HOME:  Prior to Admission medications   Medication Sig Start Date End Date Taking? Authorizing Provider  levETIRAcetam (KEPPRA) 500 MG tablet Take 500 mg by  mouth 2 (two) times daily.   Yes Historical Provider, MD  sulfamethoxazole-trimethoprim (BACTRIM DS,SEPTRA DS) 800-160 MG tablet Take 1 tablet by mouth 2 (two) times daily.   Yes Historical Provider, MD  traZODone (DESYREL) 50 MG tablet Take 25 mg by mouth at bedtime as needed for sleep.   Yes Historical Provider, MD  acetaminophen (TYLENOL) 325 MG tablet Take 650 mg by mouth every 6 (six) hours as needed.    Historical Provider, MD  cephALEXin (KEFLEX) 500 MG capsule Take 1 capsule (500 mg total) by mouth 3 (three) times daily. Patient not taking: Reported on 09/25/2016 09/03/16   Tommi Rumpshonda L Summers, PA-C  furosemide (LASIX) 20 MG tablet Take 2 tablets (40 mg total) by mouth daily. 09/11/16   Jennye MoccasinBrian S Quigley, MD  gabapentin (NEURONTIN) 100 MG capsule Take 100 mg by mouth 2 (two) times daily.    Historical Provider, MD  levothyroxine (SYNTHROID, LEVOTHROID) 25 MCG tablet Take 25 mcg by mouth daily before breakfast.    Historical Provider, MD  losartan (COZAAR) 50 MG tablet Take 50 mg by mouth daily.    Historical Provider, MD      PHYSICAL EXAMINATION:   VITAL SIGNS: Blood pressure 118/65, pulse 68, temperature 98.7 F (37.1 C), temperature source Rectal, resp. rate 17, height 5\' 4"  (1.626 m), weight 63.5 kg (140 lb), SpO2 100 %.  GENERAL:  82 y.o.-year-old patient lying in the bed with  no acute distress.  EYES: Pupils equal, round, reactive to light and accommodation. No scleral icterus. Extraocular muscles intact.  HEENT: Head atraumatic, normocephalic. Oropharynx and nasopharynx clear. Oral mucosa dry. NECK:  Supple, no jugular venous distention. No thyroid enlargement, no tenderness.  LUNGS: Normal breath sounds bilaterally, no wheezing, rales,rhonchi or crepitation. No use of accessory muscles of respiration.  CARDIOVASCULAR: S1, S2 normal. No murmurs, rubs, or gallops.  ABDOMEN: Soft, nontender, nondistended. Bowel sounds present. No organomegaly or mass.  EXTREMITIES: No pedal edema,  cyanosis, or clubbing.  NEUROLOGIC: Patient is lethargic she opens eyes and speak a few words without making any sense.    She does not follow, aunts but she moves her upper limbs spontaneously power appears to be 3-4 out of 5 and lower limbs seems very weak power 2-3 out of 5. PSYCHIATRIC: The patient is lethargic.  SKIN: No obvious rash, lesion, or ulcer.   LABORATORY PANEL:   CBC  Recent Labs Lab 09/25/16 1618  WBC 5.2  HGB 12.2  HCT 37.4  PLT 81*  MCV 88.1  MCH 28.6  MCHC 32.5  RDW 16.2*  LYMPHSABS 0.6*  MONOABS 0.7  EOSABS 0.0  BASOSABS 0.0   ------------------------------------------------------------------------------------------------------------------  Chemistries   Recent Labs Lab 09/25/16 1618  NA 139  K 4.5  CL 103  CO2 26  GLUCOSE 103*  BUN 53*  CREATININE 1.60*  CALCIUM 9.2  AST 30  ALT 11*  ALKPHOS 66  BILITOT 1.3*   ------------------------------------------------------------------------------------------------------------------ estimated creatinine clearance is 22.2 mL/min (by C-G formula based on SCr of 1.6 mg/dL (H)). ------------------------------------------------------------------------------------------------------------------ No results for input(s): TSH, T4TOTAL, T3FREE, THYROIDAB in the last 72 hours.  Invalid input(s): FREET3   Coagulation profile No results for input(s): INR, PROTIME in the last 168 hours. ------------------------------------------------------------------------------------------------------------------- No results for input(s): DDIMER in the last 72 hours. -------------------------------------------------------------------------------------------------------------------  Cardiac Enzymes  Recent Labs Lab 09/25/16 1618  TROPONINI 0.06*   ------------------------------------------------------------------------------------------------------------------ Invalid input(s):  POCBNP  ---------------------------------------------------------------------------------------------------------------  Urinalysis    Component Value Date/Time   COLORURINE AMBER (A) 09/25/2016 1616   APPEARANCEUR HAZY (A) 09/25/2016 1616   LABSPEC 1.017 09/25/2016 1616   PHURINE 5.0 09/25/2016 1616   GLUCOSEU NEGATIVE 09/25/2016 1616   HGBUR SMALL (A) 09/25/2016 1616   BILIRUBINUR NEGATIVE 09/25/2016 1616   KETONESUR NEGATIVE 09/25/2016 1616   PROTEINUR NEGATIVE 09/25/2016 1616   NITRITE NEGATIVE 09/25/2016 1616   LEUKOCYTESUR NEGATIVE 09/25/2016 1616     RADIOLOGY: Ct Head Wo Contrast  Result Date: 09/25/2016 CLINICAL DATA:  Altered mental status. EXAM: CT HEAD WITHOUT CONTRAST TECHNIQUE: Contiguous axial images were obtained from the base of the skull through the vertex without intravenous contrast. COMPARISON:  CT scan of September 11, 2016. FINDINGS: Brain: Mild diffuse cortical atrophy is noted. 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 Mild chronic ischemic white matter disease is noted. No mass effect or midline shift is noted. Ventricular size is within normal limits. There is no evidence of mass lesion, hemorrhage or acute infarction. Vascular: Atherosclerosis carotid siphons and vertebral arteries is noted. Skull: Normal. Negative for fracture or focal lesion. Sinuses/Orbits: Opacification of right frontal sinus is noted. Other: None. IMPRESSION: Mild diffuse cortical atrophy. 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 No acute intracranial abnormality seen. Electronically Signed   By: Lupita RaiderJames  Green Jr, M.D.   On: 09/25/2016 17:33   Dg Chest Port 1 View  Result Date: 09/25/2016 CLINICAL DATA:  Fever.  Altered mental status. EXAM: PORTABLE CHEST 1 VIEW COMPARISON:  09/11/2016 FINDINGS: Cardiomegaly. Improving aeration  in the right base with minimal residual right base atelectasis or infiltrate. Left lung is clear. No effusions or overt edema. No acute bony abnormality.  IMPRESSION: Cardiomegaly. Right lower lobe atelectasis or infiltrate, improved since prior study. Electronically Signed   By: Charlett Nose M.D.   On: 09/25/2016 16:42    EKG: Orders placed or performed during the hospital encounter of 09/25/16  . ED EKG 12-Lead  . ED EKG 12-Lead    IMPRESSION AND PLAN:  * Altered mental status   Most likely secondary to acute renal failure   IV fluids, monitor.   Try to get in touch with family members to get more details.   As per ER physician's note 2 weeks ago, he thought patient could be developing dementia.   Check TSH and Keppra level.  * Acute renal failure   IV fluids, monitor. Patient was taking oral Lasix, hold it for now.    * Hypertension    hold Lasix. Hold valsartan.  * Hypothyroidism   Continue levothyroxine.  * Seizures disorder   Continue Keppra at current dose until we check the level.  All the records are reviewed and case discussed with ED provider. Management plans discussed with the patient, family and they are in agreement.  CODE STATUS: full code  Code Status History    Date Active Date Inactive Code Status Order ID Comments User Context   07/29/2016 12:59 PM 07/30/2016  5:02 PM Full Code [id number]  Auburn Bilberry, MD ED       TOTAL TIME TAKING CARE OF THIS PATIENT: 50 minutes.    Altamese Dilling M.D on 09/25/2016   Between 7am to 6pm - Pager - [phone]  After 6pm go to [email or web address] - password [employer]  [facility]  Office  [phone]  CC: Primary care physician; Arlyss Queen, MD   Note: This dictation was prepared with Dragon dictation along with smaller phrase technology. Any transcriptional errors that result from this process are unintentional.

## 2016-09-25 NOTE — ED Provider Notes (Signed)
[facility] Emergency Department Provider Note     L5 caveat: Review of systems and history is limited by confusion  Time seen: ----------------------------------------- 4:13 PM on 09/25/2016 -----------------------------------------    I have reviewed the triage vital signs and the nursing notes.   HISTORY  Chief Complaint No chief complaint on file.    HPI Stefanie [name] is a 82 y.o. female who presents to the ER for generalized weakness as well as hallucinations and altered mental status. Family is not sure if it is related to infection or to CVA or TIA. They report decreased oral intake other than water as well as a recent increase in her Lasix and medications for UTI. He is not had a fever but has complained of some leg pain. Family states they have been unable to get her up today there.   Past Medical History:  Diagnosis Date  . A-fib (HCC)   . AKI (acute kidney injury) (HCC)   . Hypertension   . Left nephrolithiasis   . Seizure [facility](HCC)     Patient Active Problem List   Diagnosis Date Noted  . Chest pain 07/29/2016    Past Surgical History:  Procedure Laterality Date  . none      Allergies Patient has no known allergies.  Social History Social History  Substance Use Topics  . Smoking status: Former Games developermoker  . Smokeless tobacco: Never Used  . Alcohol use No    Review of Systems Unknown at this time, positive for weakness  ____________________________________________   PHYSICAL EXAM:  VITAL SIGNS: ED Triage Vitals  Enc Vitals Group     BP      Pulse      Resp      Temp      Temp src      SpO2      Weight      Height      Head Circumference      Peak Flow      Pain Score      Pain Loc      Pain Edu?      Excl. in GC?     Constitutional: Alert but disoriented, no distress  Eyes: Normal extraocular movements. ENT   Head: Normocephalic and atraumatic.   Nose: No congestion/rhinnorhea.   Mouth/Throat:  Mucous membranes are moist.   Neck: No stridor. Cardiovascular: Normal rate, regular rhythm. No murmurs, rubs, or gallops. Respiratory: Normal respiratory effort without tachypnea nor retractions. Mild expiratory wheezing Gastrointestinal: Soft and nontender. Normal bowel sounds Musculoskeletal: Contractures of the lower extremities, No lower extremity tenderness nor edema. Neurologic:  Normal speech and language. No gross focal neurologic deficits are appreciated.  Skin:  Skin is warm, dry and intact. No rash noted. ____________________________________________  EKG: Interpreted by me. Atrial fibrillation with a rate of 78 bpm, anteroseptal infarct age indeterminate, T-wave inversions diffusely, normal axis.  ____________________________________________  ED COURSE:  Pertinent labs & imaging results that were available during my care of the patient were reviewed by me and considered in my medical decision making (see chart for details). Clinical Course   Procedures Patient is found to beConfused with hypotension, we will initiate sepsis protocols. ___________________________________________   LABS (pertinent positives/negatives)  Labs Reviewed  COMPREHENSIVE METABOLIC PANEL - Abnormal; Notable for the following:       Result Value   Glucose, Bld 103 (*)    BUN 53 (*)    Creatinine, Ser 1.60 (*)    Albumin 3.3 (*)  ALT 11 (*)    Total Bilirubin 1.3 (*)    GFR calc non Af Amer 28 (*)    GFR calc Af Amer 33 (*)    All other components within normal limits  TROPONIN I - Abnormal; Notable for the following:    Troponin I 0.06 (*)    All other components within normal limits  CBC WITH DIFFERENTIAL/PLATELET - Abnormal; Notable for the following:    RDW 16.2 (*)    Platelets 81 (*)    Lymphs Abs 0.6 (*)    All other components within normal limits  URINALYSIS, COMPLETE (UACMP) WITH MICROSCOPIC - Abnormal; Notable for the following:    Color, Urine AMBER (*)    APPearance  HAZY (*)    Hgb urine dipstick SMALL (*)    Bacteria, UA RARE (*)    Squamous Epithelial / LPF 0-5 (*)    All other components within normal limits  CULTURE, BLOOD (ROUTINE X 2)  CULTURE, BLOOD (ROUTINE X 2)  URINE CULTURE  LACTIC ACID, PLASMA  BLOOD GAS, VENOUS  URINALYSIS COMPLETEWITH MICROSCOPIC ([facility] ONLY)    RADIOLOGY Images were viewed by me   chest x-ray, CT head  IMPRESSION: Cardiomegaly.  Right lower lobe atelectasis or infiltrate, improved since prior Study. IMPRESSION: Mild diffuse cortical atrophy. 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 No acute intracranial abnormality seen.   __________________________________________  FINAL ASSESSMENT AND PLAN  Weakness, Altered mental status, Dehydration, elevated troponin    Plan: Patient with labs and imaging as dictated above. Patient's no acute distress, vital signs are stable at this time. Patient initially started on sepsis protocol, she was likely more dehydrated than septic. She has received IV antibiotics nonetheless. She will need repeat troponin and further evaluation. I will discuss with the hospitalist for admission   Emily FilbertWilliams, Jonathan E, MD   Note: This dictation was prepared with Dragon dictation. Any transcriptional errors that result from this process are unintentional    Emily FilbertJonathan E Williams, MD 09/25/16 1929

## 2016-09-25 NOTE — ED Notes (Signed)
Called code sepsis to [facility] at 1627

## 2016-09-25 NOTE — ED Notes (Signed)
Family went home.  Number to reach are Olegario MessierKathy (Granddaughter) [phone] and Kara Meadmma (daughter) [phone].

## 2016-09-25 NOTE — ED Triage Notes (Signed)
Patient from home via [facility] for AMS. Patient has been on antibiotics for UTI. Family reports increased altered mental status and weakness since Sunday. Patient able to ambulate and answer questions. Upon arrival to ED patient is able to give name and birthday. Has garbled, soft speech.

## 2016-09-26 DIAGNOSIS — R4182 Altered mental status, unspecified: Secondary | ICD-10-CM

## 2016-09-26 DIAGNOSIS — Z7189 Other specified counseling: Secondary | ICD-10-CM

## 2016-09-26 DIAGNOSIS — N179 Acute kidney failure, unspecified: Secondary | ICD-10-CM | POA: Diagnosis not present

## 2016-09-26 DIAGNOSIS — Z515 Encounter for palliative care: Secondary | ICD-10-CM

## 2016-09-26 LAB — BASIC METABOLIC PANEL
Anion gap: 6 (ref 5–15)
BUN: 41 mg/dL — AB (ref 6–20)
CHLORIDE: 110 mmol/L (ref 101–111)
CO2: 24 mmol/L (ref 22–32)
Calcium: 8.5 mg/dL — ABNORMAL LOW (ref 8.9–10.3)
Creatinine, Ser: 1.09 mg/dL — ABNORMAL HIGH (ref 0.44–1.00)
GFR calc Af Amer: 52 mL/min — ABNORMAL LOW (ref >60)
GFR calc non Af Amer: 45 mL/min — ABNORMAL LOW (ref >60)
GLUCOSE: 91 mg/dL (ref 65–99)
POTASSIUM: 3.8 mmol/L (ref 3.5–5.1)
Sodium: 140 mmol/L (ref 135–145)

## 2016-09-26 LAB — CBC
HEMATOCRIT: 33.1 % — AB (ref 35.0–47.0)
Hemoglobin: 11.1 g/dL — ABNORMAL LOW (ref 12.0–16.0)
MCH: 29.9 pg (ref 26.0–34.0)
MCHC: 33.6 g/dL (ref 32.0–36.0)
MCV: 88.9 fL (ref 80.0–100.0)
Platelets: 74 10*3/uL — ABNORMAL LOW (ref 150–440)
RBC: 3.72 MIL/uL — ABNORMAL LOW (ref 3.80–5.20)
RDW: 15.8 % — AB (ref 11.5–14.5)
WBC: 4.1 10*3/uL (ref 3.6–11.0)

## 2016-09-26 NOTE — Care Management (Signed)
Admitted to [facility] with the diagnosis of altered mental status. Discharged from this facility 07/29/16. Lives with daughter, Kara Meadmma, [phone]). Son is [name] [phone]). Last seen Dr. Illene RegulusSelvidge 2 weeks ago.  [facility] Nursing [facility] [place] [place] about 6 months ago. No home health. No home oxygen. Rolling walker and wheelchair in the home. Prescriptions are filled at [facility] on [address], Usually up during the day. Daughter and other family members help with daily needed. Son will transport.  Gwenette GreetBrenda S Kassius Battiste RN MSN CCM Care management

## 2016-09-26 NOTE — Consult Note (Signed)
Consultation Note Date: 09/27/2016   Patient Name: Stefanie [name]  DOB: 02/17/1929  MRN: [id number]  Age / Sex: 82 y.o., female  PCP: Stefanie Kuba, MD Referring Physician: Epifanio Lesches, MD  Reason for Consultation: Establishing goals of care  HPI/Patient Profile: 82 y.o. female  with past medical history of seizure, left nephrolithiasis, hypertension, AKI, and afib  admitted on 09/25/2016 with lethargy and generalized weakness. In ED, workup for infection was negative including CT head. She was in acute renal failure and started on IVF. Per attending, AMS most likely due to acute renal failure or dementia. Palliative medicine consultation for goals of care.   Clinical Assessment and Goals of Care: This NP met patient and granddaughter, Stefanie [name] at bedside. Patient is alert and oriented to person and place. Denies pain or discomfort. I am able to have a full conversation with her. Stefanie [name] tells me she is back at baseline and is shocked that no source of infection was found. Stefanie [name] tells me the patient's mind is better than hers although the last three weeks, Ms. Stefanie [name] has been up all night and sleeping during the day, despite a sleeping pill every evening. Patient has been living with daughter, Stefanie [name]. Stefanie [name] tells me she is able to ambulate independently once assisted up and has a good appetite. Her left knee has been bothering her lately and it has been more challenging for daughter, Stefanie [name] to care for her alone. Stefanie [name] wonders if her grandmother has been sad because she is not living at her own house since hospitalization at [name] for UTI. The patient has always been an independent woman.   Stefanie [name] had to return back to work [employer]. Left VM for daughter, Stefanie [name] but have not yet heard back. Further conversations regarding goals of care and advanced directives are needed.   SUMMARY OF RECOMMENDATIONS    Briefly  spoke with patient and granddaughter. Left VM for daughter, Stefanie [name]. Patient and family need further GOC and code status discussions.   PMT will attempt to f/u with family tomorrow, 12/7.   Code Status/Advance Care Planning:  Full code   Symptom Management:   Per patient  Palliative Prophylaxis:   Aspiration, Delirium Protocol, Oral Care and Turn Reposition  Additional Recommendations (Limitations, Scope, Preferences):  Full Scope Treatment  Psycho-social/Spiritual:   Desire for further Chaplaincy support:no  Additional Recommendations: Caregiving  Support/Resources  Prognosis:   Unable to determine  Discharge Planning: To Be Determined      Primary Diagnoses: Present on Admission: **None**  I have reviewed the medical record, interviewed the patient and family, and examined the patient. The following aspects are pertinent.  Past Medical History:  Diagnosis Date  . A-fib (Stefanie [name])   . AKI (acute kidney injury) (Stefanie [name])   . Hypertension   . Left nephrolithiasis   . Seizure Stefanie [name])    Social History   Social History  . Marital status: Widowed    Spouse name: N/A  . Number of children: N/A  . Years of education: N/A   Social History  Main Topics  . Smoking status: Former Research scientist (life sciences)  . Smokeless tobacco: Never Used  . Alcohol use No  . Drug use: No  . Sexual activity: Not Asked   Other Topics Concern  . None   Social History Narrative  . None   Family History  Problem Relation Age of Onset  . Hypertension Mother    Scheduled Meds: . heparin  5,000 Units Subcutaneous Q8H  . levETIRAcetam  500 mg Oral BID  . levothyroxine  25 mcg Oral QAC breakfast  . sodium chloride flush  3 mL Intravenous Q12H   Continuous Infusions: PRN Meds:. Medications Prior to Admission:  Prior to Admission medications   Medication Sig Start Date End Date Taking? Authorizing Provider  levETIRAcetam (KEPPRA) 500 MG tablet Take 500 mg by mouth 2 (two) times daily.   Yes Historical  Provider, MD  sulfamethoxazole-trimethoprim (BACTRIM DS,SEPTRA DS) 800-160 MG tablet Take 1 tablet by mouth 2 (two) times daily.   Yes Historical Provider, MD  traZODone (DESYREL) 50 MG tablet Take 25 mg by mouth at bedtime as needed for sleep.   Yes Historical Provider, MD  acetaminophen (TYLENOL) 325 MG tablet Take 650 mg by mouth every 6 (six) hours as needed.    Historical Provider, MD  cephALEXin (KEFLEX) 500 MG capsule Take 1 capsule (500 mg total) by mouth 3 (three) times daily. Patient not taking: Reported on 09/25/2016 09/03/16   Stefanie [name]  furosemide (LASIX) 20 MG tablet Take 2 tablets (40 mg total) by mouth daily. 09/11/16   Stefanie Larsen, MD  gabapentin (NEURONTIN) 100 MG capsule Take 100 mg by mouth 2 (two) times daily.    Historical Provider, MD  levothyroxine (SYNTHROID, LEVOTHROID) 25 MCG tablet Take 25 mcg by mouth daily before breakfast.    Historical Provider, MD  losartan (COZAAR) 50 MG tablet Take 50 mg by mouth daily.    Historical Provider, MD   No Known Allergies Review of Systems  Physical Exam  Constitutional: She is cooperative.  Cardiovascular: Regular rhythm and normal heart sounds.   Pulmonary/Chest: Effort normal and breath sounds normal.  Abdominal: Soft. Bowel sounds are normal.  Neurological: She is alert.  Oriented to person and place  Skin: Skin is warm and dry. There is pallor.  Psychiatric: She has a normal mood and affect. Her speech is normal and behavior is normal.  Nursing note and vitals reviewed.  Vital Signs: BP (!) 133/50 (BP Location: Right Arm)   [name] 62   Temp 97.5 F (36.4 C) (Oral)   Resp 17   Ht 5' 4"  (1.626 m)   Wt 59 kg (130 lb)   SpO2 99%   BMI 22.31 kg/m  Pain Assessment: No/denies pain     SpO2: SpO2: 99 % O2 Device:SpO2: 99 % O2 Flow Rate: .   IO: Intake/output summary:   Intake/Output Summary (Last 24 hours) at 09/27/16 0853 Last data filed at 09/26/16 1800  Gross per 24 hour  Intake               120 ml  Output                0 ml  Net              120 ml   LBM: Last BM Date: 09/24/16 Baseline Weight: Weight: 63.5 kg (140 lb) Most recent weight: Weight: 59 kg (130 lb)     Palliative Assessment/Data: PPS 40%   Flowsheet Rows   Flowsheet  Row Most Recent Value  Intake Tab  Referral Department  Hospitalist  Unit at Time of Referral  Oncology Unit  Palliative Care Primary Diagnosis  Neurology  Date Notified  09/25/16  Palliative Care Type  New Palliative care  Reason for referral  Clarify Goals of Care  Date of Admission  09/25/16  # of days IP prior to Palliative referral  0  Clinical Assessment  Palliative Performance Scale Score  40%  Psychosocial & Spiritual Assessment  Palliative Care Outcomes  Patient/Family meeting held?  Yes  Who was at the meeting?  patient and granddaughter Stefanie [name])  Palliative Care Outcomes  Clarified goals of care, Provided psychosocial or spiritual support     Time In: 1400 Time Out: 1450 Time Total: 31mn Greater than 50%  of this time was spent counseling and coordinating care related to the above assessment and plan.  Signed by:  MIhor Dow FNP-C Palliative Medicine Team  Phone: [phone]: [phone]  Please contact Palliative Medicine Team phone at [phone] questions and concerns.  For individual provider: See AShea Evans

## 2016-09-26 NOTE — Care Management Important Message (Signed)
Important Message  Patient Details  Name: Stefanie [name] MRN: [id number] Date of Birth: [birth year]   Medicare Important Message Given:  Yes    Gwenette GreetBrenda S Sedona Wenk, RN 09/26/2016, 10:49 AM

## 2016-09-26 NOTE — Evaluation (Signed)
Physical Therapy Evaluation Patient Details Name: Georgetta Haberattie Andonian MRN: [id number] DOB: 1929-04-11 Today's Date: 09/26/2016   History of Present Illness  presented to ER secondary to generalized weakness, AMS; admitted with renal failure and dehydration.  Clinical Impression  Upon evaluation, patient alert and oriented to self only; generally confused with difficulty following commands/attending to task at times.  Speech intermittently garbled and non-sensical at times.  Globally weak and deconditioned with bilat UE/LE strength generally 3-/5.  Requiring mod/max assist +2 for bed mobility and sit/stand with RW.  Very poor/absent balance reactions; unsafe/unable to attempt OOB or gait efforts at this time. Would benefit from skilled PT to address above deficits and promote optimal return to PLOF; recommend transition to STR upon discharge from acute hospitalization.     Follow Up Recommendations SNF    Equipment Recommendations       Recommendations for Other Services       Precautions / Restrictions Precautions Precautions: Fall Restrictions Weight Bearing Restrictions: No      Mobility  Bed Mobility Overal bed mobility: Needs Assistance Bed Mobility: Supine to Sit     Supine to sit: Mod assist Sit to supine: Max assist;+2 for physical assistance      Transfers Overall transfer level: Needs assistance Equipment used: Rolling walker (2 wheeled) Transfers: Sit to/from Stand Sit to Stand: Max assist;+2 physical assistance         General transfer comment: very limited active use/weight acceptance through bilat LEs; maintains triple flexed posture; poor balance  Ambulation/Gait             General Gait Details: unsafe/unable  Stairs            Wheelchair Mobility    Modified Rankin (Stroke Patients Only)       Balance Overall balance assessment: Needs assistance Sitting-balance support: No upper extremity supported;Feet supported Sitting  balance-Leahy Scale: Fair     Standing balance support: Bilateral upper extremity supported Standing balance-Leahy Scale: Zero                               Pertinent Vitals/Pain Pain Assessment: Faces Faces Pain Scale: Hurts even more Pain Location: L knee Pain Descriptors / Indicators: Aching;Tender Pain Intervention(s): Limited activity within patient's tolerance;Monitored during session;Repositioned    Home Living Family/patient expects to be discharged to:: Private residence Living Arrangements: Children               Additional Comments: Patient significantly confused; unable to provide accurate information regarding social history of PLOF.    Prior Function           Comments: Patient significantly confused; unable to provide accurate information regarding social history of PLOF.     Hand Dominance        Extremity/Trunk Assessment   Upper Extremity Assessment: Generalized weakness (grossly at least 3-/5 throughout)           Lower Extremity Assessment: Generalized weakness (grossly at least 3-/5 throughout)         Communication   Communication:  (speech garbled and non-sensical at times)  Cognition Arousal/Alertness: Lethargic Behavior During Therapy: Impulsive Overall Cognitive Status: Difficult to assess (oriented to self only; follows simple, one-step commands 75% time.  Poor insight/awareness)                      General Comments      Exercises Other Exercises Other Exercises: Sit/stand x2 with  RW, max assist +2--poor active participation with movement transition; poor balance Other Exercises: Dep for rolling, bed mobility and hygiene/linen change after incontinent bladder episode   Assessment/Plan    PT Assessment Patient needs continued PT services  PT Problem List Decreased strength;Decreased range of motion;Decreased activity tolerance;Decreased balance;Decreased mobility;Decreased cognition;Decreased  knowledge of use of DME;Decreased safety awareness;Decreased knowledge of precautions;Pain          PT Treatment Interventions DME instruction;Gait training;Functional mobility training;Therapeutic activities;Therapeutic exercise;Balance training;Patient/family education;Cognitive remediation    PT Goals (Current goals can be found in the Care Plan section)  Acute Rehab PT Goals PT Goal Formulation: Patient unable to participate in goal setting Time For Goal Achievement: 10/10/16 Potential to Achieve Goals: Fair    Frequency Min 2X/week   Barriers to discharge Decreased caregiver support      Co-evaluation               End of Session Equipment Utilized During Treatment: Gait belt Activity Tolerance: Patient limited by fatigue Patient left: in bed;with bed alarm set;with call bell/phone within reach           Time: 1507-1526 PT Time Calculation (min) (ACUTE ONLY): 19 min   Charges:   PT Evaluation $PT Eval Low Complexity: 1 Procedure PT Treatments $Therapeutic Activity: 8-22 mins   PT G Codes:        Decarlos Empey H. Manson PasseyBrown, PT, DPT, NCS 09/26/16, 9:02 PM [phone]

## 2016-09-26 NOTE — NC FL2 (Signed)
  [place] MEDICAID FL2 LEVEL OF CARE SCREENING TOOL     IDENTIFICATION  Patient Name: Stefanie [name] Birthdate: [birth year] Sex: female Admission Date (Current Location): 09/25/2016  [place]   Facility and Address:  [facility], [address], [place], [place] [address]      Provider Number: [id number]  Attending Physician Name and Address:  Enedina FinnerSona Patel, MD  Relative Name and Phone Number:       Current Level of Care: Hospital Recommended Level of Care: Skilled Nursing Facility Prior Approval Number:    Date Approved/Denied:   PASRR Number:  [id number] [id number])  Discharge Plan: SNF    Current Diagnoses: Patient Active Problem List   Diagnosis Date Noted  . Altered mental status 09/25/2016  . Generalized weakness 09/25/2016  . Chest pain 07/29/2016    Orientation RESPIRATION BLADDER Height & Weight     Self  Normal Incontinent Weight: 130 lb (59 kg) Height:  5\' 4"  (162.6 cm)  BEHAVIORAL SYMPTOMS/MOOD NEUROLOGICAL BOWEL NUTRITION STATUS   (none) Convulsions/Seizures (history of seizures) Continent Diet (Diet: Soft )  AMBULATORY STATUS COMMUNICATION OF NEEDS Skin   Extensive Assist Verbally Surgical wounds (Incision: Mid Abdomen)                       Personal Care Assistance Level of Assistance  Bathing, Feeding, Dressing Bathing Assistance: Limited assistance Feeding assistance: Independent Dressing Assistance: Limited assistance     Functional Limitations Info  Sight, Hearing, Speech Sight Info: 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 ) Allergies Info:  (No Known Allergies. )           Current Medications (09/26/2016):  This is  the current hospital active medication list Current Facility-Administered Medications  Medication Dose Route Frequency Provider Last Rate Last Dose  . heparin injection 5,000 Units  5,000 Units Subcutaneous Q8H Altamese DillingVaibhavkumar Vachhani, MD   5,000 Units at 09/26/16 1542  . levETIRAcetam (KEPPRA) tablet 500 mg  500 mg Oral BID Altamese DillingVaibhavkumar Vachhani, MD   500 mg at 09/26/16 0914  . levothyroxine (SYNTHROID, LEVOTHROID) tablet 25 mcg  25 mcg Oral QAC breakfast Altamese DillingVaibhavkumar Vachhani, MD   25 mcg at 09/26/16 0914  . sodium chloride flush (NS) 0.9 % injection 3 mL  3 mL Intravenous Q12H Altamese DillingVaibhavkumar Vachhani, MD   3 mL at 09/26/16 [id number]     Discharge Medications: Please see discharge summary for a list of discharge medications.  Relevant Imaging Results:  Relevant Lab Results:   Additional Information  (SSN: [id number])  Summerlyn Fickel, Darleen CrockerBailey M, LCSW

## 2016-09-26 NOTE — Progress Notes (Addendum)
[facility] [place] [facility]   PATIENT NAME: Stefanie [name]    MR#:  [id number]  DATE OF BIRTH:  1929-09-28  SUBJECTIVE:   Came in with weakness and found to have dehydration REVIEW OF SYSTEMS:   Review of Systems  Unable to perform ROS: Mental status change   Tolerating Diet:yes Tolerating PT: STR  DRUG ALLERGIES:  No Known Allergies  VITALS:  Blood pressure (!) 114/54, pulse 73, temperature 98 F (36.7 C), temperature source Oral, resp. rate 17, height 5\' 4"  (1.626 m), weight 59 kg (130 lb), SpO2 98 %.  PHYSICAL EXAMINATION:   Physical Exam  GENERAL:  82 y.o.-year-old patient lying in the bed with no acute distress.  EYES: Pupils equal, round, reactive to light and accommodation. No scleral icterus. Extraocular muscles intact.  HEENT: Head atraumatic, normocephalic. Oropharynx and nasopharynx clear.  NECK:  Supple, no jugular venous distention. No thyroid enlargement, no tenderness.  LUNGS: Normal breath sounds bilaterally, no wheezing, rales, rhonchi. No use of accessory muscles of respiration.  CARDIOVASCULAR: S1, S2 normal. No murmurs, rubs, or gallops.  ABDOMEN: Soft, nontender, nondistended. Bowel sounds present. No organomegaly or mass.  EXTREMITIES: No cyanosis, clubbing or edema b/l.    NEUROLOGIC: Cranial nerves II through XII are intact. No focal Motor or sensory deficits b/l.   PSYCHIATRIC:  patient is alert and oriented x 3.  SKIN: No obvious rash, lesion, or ulcer.   LABORATORY PANEL:  CBC  Recent Labs Lab 09/26/16 0453  WBC 4.1  HGB 11.1*  HCT 33.1*  PLT 74*    Chemistries   Recent Labs Lab 09/25/16 1618 09/26/16 0453  NA 139 140  K 4.5 3.8  CL 103 110  CO2 26 24  GLUCOSE 103* 91  BUN 53* 41*  CREATININE 1.60* 1.09*  CALCIUM 9.2 8.5*  AST 30  --   ALT 11*  --   ALKPHOS 66  --   BILITOT 1.3*  --    Cardiac Enzymes  Recent Labs Lab 09/25/16 1618  TROPONINI 0.06*   RADIOLOGY:  Ct Head Wo  Contrast  Result Date: 09/25/2016 CLINICAL DATA:  Altered mental status. EXAM: CT HEAD WITHOUT CONTRAST TECHNIQUE: Contiguous axial images were obtained from the base of the skull through the vertex without intravenous contrast. COMPARISON:  CT scan of September 11, 2016. FINDINGS: Brain: Mild diffuse cortical atrophy is noted. 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 Mild chronic ischemic white matter disease is noted. No mass effect or midline shift is noted. Ventricular size is within normal limits. There is no evidence of mass lesion, hemorrhage or acute infarction. Vascular: Atherosclerosis carotid siphons and vertebral arteries is noted. Skull: Normal. Negative for fracture or focal lesion. Sinuses/Orbits: Opacification of right frontal sinus is noted. Other: None. IMPRESSION: Mild diffuse cortical atrophy. 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 No acute intracranial abnormality seen. Electronically Signed   By: Lupita RaiderJames  Green Jr, M.D.   On: 09/25/2016 17:33   Dg Chest Port 1 View  Result Date: 09/25/2016 CLINICAL DATA:  Fever.  Altered mental status. EXAM: PORTABLE CHEST 1 VIEW COMPARISON:  09/11/2016 FINDINGS: Cardiomegaly. Improving aeration in the right base with minimal residual right base atelectasis or infiltrate. Left lung is clear. No effusions or overt edema. No acute bony abnormality. IMPRESSION: Cardiomegaly. Right lower lobe atelectasis or infiltrate, improved since prior study. Electronically Signed   By: Charlett NoseKevin  Dover M.D.   On: 09/25/2016 16:42   ASSESSMENT AND PLAN:  Stefanie [name]  is a 82  y.o. female with a known history of Atrial fibrillation, hypertension, hydronephrosis, seizures, hypothyroidism- brought to emergency room because of lethargic and generalized weakness  * Altered mental status   Most likely secondary to acute renal failure   IV fluids Pt could be developing dementia  * Acute renal failure   IV fluids, monitor. Patient was taking oral Lasix, hold it for  now. No need to start lasix since she has no volume overload    * Hypertension    hold Lasix. Hold valsartan.  * Hypothyroidism   Continue levothyroxine.  * Seizures disorder   Continue Keppra at current dose until we check the level.  PT recommends rehab.  D/w CSW  Case discussed with Care Management/Social Worker. Management plans discussed with the patient, family and they are in agreement.  CODE STATUS:FUll  DVT Prophylaxis: lovenox  TOTAL TIME TAKING CARE OF THIS PATIENT: 30 minutes.  >50% time spent on counselling and coordination of care  POSSIBLE D/C IN 1 DAYS, DEPENDING ON CLINICAL CONDITION.  Note: This dictation was prepared with Dragon dictation along with smaller phrase technology. Any transcriptional errors that result from this process are unintentional.  Nilesh Stegall M.D on 09/26/2016 at 8:18 PM  Between 7am to 6pm - Pager - [phone]  After 6pm go to [email or web address] - password [email or web address] [facility]  Office  [phone]  CC: Primary care physician; Arlyss QueenSELVIDGE,WILLIAM M, MD

## 2016-09-27 DIAGNOSIS — Z515 Encounter for palliative care: Secondary | ICD-10-CM

## 2016-09-27 DIAGNOSIS — R4182 Altered mental status, unspecified: Secondary | ICD-10-CM | POA: Diagnosis not present

## 2016-09-27 DIAGNOSIS — N179 Acute kidney failure, unspecified: Secondary | ICD-10-CM | POA: Diagnosis not present

## 2016-09-27 DIAGNOSIS — Z7189 Other specified counseling: Secondary | ICD-10-CM

## 2016-09-27 LAB — URINE CULTURE: CULTURE: NO GROWTH

## 2016-09-27 LAB — CBC
HEMATOCRIT: 34.5 % — AB (ref 35.0–47.0)
Hemoglobin: 11.2 g/dL — ABNORMAL LOW (ref 12.0–16.0)
MCH: 28.6 pg (ref 26.0–34.0)
MCHC: 32.4 g/dL (ref 32.0–36.0)
MCV: 88.1 fL (ref 80.0–100.0)
PLATELETS: 101 10*3/uL — AB (ref 150–440)
RBC: 3.92 MIL/uL (ref 3.80–5.20)
RDW: 15.8 % — ABNORMAL HIGH (ref 11.5–14.5)
WBC: 2.5 10*3/uL — AB (ref 3.6–11.0)

## 2016-09-27 NOTE — Progress Notes (Signed)
[facility] [place] [phone] Report given to Leo GrosserJennifer Clapp, LPN. Patient going to room 223-A. Per LPN, patient's room is ready. EMS transport called to transport patient.

## 2016-09-27 NOTE — Discharge Summary (Signed)
Stefanie [name], is a 82 y.o. female  DOB June 04, 1929  MRN [id number].  Admission date:  09/25/2016  Admitting Physician  Altamese Dilling, MD  Discharge Date:  09/27/2016   Primary MD  Arlyss Queen, MD  Recommendations for primary care physician for things to follow:   Follow-up with primary doctor in 1 week   Admission Diagnosis  Dehydration [E86.0] Elevated troponin I level [R74.8] AKI (acute kidney injury) (HCC) [N17.9] Altered mental status, unspecified altered mental status type [R41.82]   Discharge Diagnosis  Dehydration [E86.0] Elevated troponin I level [R74.8] AKI (acute kidney injury) (HCC) [N17.9] Altered mental status, unspecified altered mental status type [R41.82]   Principal Problem:   Altered mental status Active Problems:   Generalized weakness   Palliative care encounter   Goals of care, counseling/discussion      Past Medical History:  Diagnosis Date  . A-fib (HCC)   . AKI (acute kidney injury) (HCC)   . Hypertension   . Left nephrolithiasis   . Seizure [facility])     Past Surgical History:  Procedure Laterality Date  . none         History of present illness and  Hospital Course:     Kindly see H&P for history of present illness and admission details, please review complete Labs, Consult reports and Test reports for all details in brief  HPI  from the history and physical done on the day of admission 82 year old female patient with history of chronic atrial fibrillation, essential hypertension, seizure disorder, hypothyroidism brought in by family because of lethargy, generalized weakness. Head CT unremarkable on admission, admitted for acute renal failure/   Hospital Course  #1 altered mental status secondary to worsening dementia, acute renal failure: Received IV hydration,  his creatinine improved from 1.60-1.09. Patient was still the is oriented to person only. Indicating that she has dementia. According to the nurse patient to is able to swallow without problems but the by mouth intake is not  consistent. Asian blood cultures, urine culture did not show any growth.  #2 is hypertension: Patient Lasix, losartan are stopped because of renal failure.  BP is soft ,please hold them at discharge. #3 chronic atrial fibrillation #4. history of seizure disorder.     Discharge Condition:stable   Follow UP  Contact information for after-discharge care    Destination    [facility] .   Specialty:  Skilled Nursing Facility Contact information: [address] [place] [address] [phone]                Discharge Instructions  and  Discharge Medications        Medication List    STOP taking these medications   cephALEXin 500 MG capsule Commonly known as:  KEFLEX   furosemide 20 MG tablet Commonly known as:  LASIX   losartan 50 MG tablet Commonly known as:  COZAAR   sulfamethoxazole-trimethoprim 800-160 MG tablet Commonly known as:  BACTRIM DS,SEPTRA DS     TAKE these medications   acetaminophen 325 MG tablet Commonly known as:  TYLENOL Take 650 mg by mouth every 6 (six) hours as needed.   gabapentin 100 MG capsule Commonly known as:  NEURONTIN Take 100 mg by mouth 2 (two) times daily.   levETIRAcetam 500 MG tablet Commonly known as:  KEPPRA Take 500 mg by mouth 2 (two) times daily.   levothyroxine 25 MCG tablet Commonly known as:  SYNTHROID, LEVOTHROID Take 25 mcg by mouth daily  before breakfast.   traZODone 50 MG tablet Commonly known as:  DESYREL Take 25 mg by mouth at bedtime as needed for sleep.         Diet and Activity recommendation: See Discharge Instructions above   Consults obtained - PT  Major procedures and Radiology Reports - PLEASE review detailed and final  reports for all details, in brief -      Dg Chest 2 View  Result Date: 09/11/2016 CLINICAL DATA:  Diffuse abdominal pain, weakness EXAM: CHEST  2 VIEW COMPARISON:  Chest x-ray of 07/29/2016 FINDINGS: The lungs are not well aerated. Prominent interstitial markings are present which may indicate mild interstitial edema. There is more opacity at the right lung base possibly reflecting atelectasis, but small effusion and/or developing pneumonia cannot be excluded. A rounded nodular opacity at the right lung base is not seen in review of the prior chest x-ray and this could be due to right pleural effusion and/or atelectasis. Cardiomegaly is stable. The bones are osteopenic IMPRESSION: 1. Prominent interstitial markings most likely reflect mild interstitial edema with poor inspiration. 2. Fluoro passed at the right lung base may be due to atelectasis, effusion, or possibly developing pneumonia . Electronically Signed   By: Dwyane Dee M.D.   On: 09/11/2016 10:38   Ct Head Wo Contrast  Result Date: 09/25/2016 CLINICAL DATA:  Altered mental status. EXAM: CT HEAD WITHOUT CONTRAST TECHNIQUE: Contiguous axial images were obtained from the base of the skull through the vertex without intravenous contrast. COMPARISON:  CT scan of September 11, 2016. FINDINGS: Brain: Mild diffuse cortical atrophy is noted. 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 Mild chronic ischemic white matter disease is noted. No mass effect or midline shift is noted. Ventricular size is within normal limits. There is no evidence of mass lesion, hemorrhage or acute infarction. Vascular: Atherosclerosis carotid siphons and vertebral arteries is noted. Skull: Normal. Negative for fracture or focal lesion. Sinuses/Orbits: Opacification of right frontal sinus is noted. Other: None. IMPRESSION: Mild diffuse cortical atrophy. 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 No acute intracranial abnormality seen. Electronically Signed   By: Lupita Raider,  M.D.   On: 09/25/2016 17:33   Ct Head Wo Contrast  Result Date: 09/11/2016 CLINICAL DATA:  Altered mental status, UTI, confusion, weakness, knees gave out EXAM: CT HEAD WITHOUT CONTRAST TECHNIQUE: Contiguous axial images were obtained from the base of the skull through the vertex without intravenous contrast. COMPARISON:  None FINDINGS: Brain: Generalized atrophy. Normal ventricular morphology. No midline shift or mass effect. Small vessel chronic ischemic changes of deep cerebral white matter. Old appearing frontal lobe infarcts bilaterally larger on LEFT. No intracranial hemorrhage, mass lesion, evidence of acute infarction, or extra-axial fluid collection. Calcifications in falx. Vascular: Atherosclerotic calcification of internal carotid and vertebral arteries at skullbase. Prominent basilar tip 4.7 mm transverse. Skull: Intact but demineralized Sinuses/Orbits: Partially opacified RIGHT frontal sinus. Remaining visualized paranasal sinuses and mastoid air cells clear Other: N/A IMPRESSION: Atrophy with small vessel chronic ischemic changes of deep cerebral white matter. 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 No definite acute intracranial abnormalities. Prominent basilar tip 4.7 mm diameter with extensive atherosclerotic calcifications at skullbase. Electronically Signed   By: Ulyses Southward M.D.   On: 09/11/2016 15:46   Ct Abdomen Pelvis W Contrast  Result Date: 09/11/2016 CLINICAL DATA:  Diffuse abdominal pain, on antibiotics for UTI for over week but not getting better, confusion, weakness, draining abdominal wound, history atrial fibrillation, kidney stones, hypertension, former smoker EXAM: CT ABDOMEN AND  PELVIS WITH CONTRAST TECHNIQUE: Multidetector CT imaging of the abdomen and pelvis was performed using the standard protocol following bolus administration of intravenous contrast. Sagittal and coronal MPR images reconstructed from axial data set. CONTRAST:  100mL ISOVUE-300 IOPAMIDOL (ISOVUE-300)  INJECTION 61% IV. Dilute oral contrast. COMPARISON:  None FINDINGS: Lower chest: Bibasilar pleural effusions and atelectasis RIGHT greater than LEFT. Pericardial effusion. Dilated cardiac chambers. Coronary arterial calcifications. Hepatobiliary: Liver normal appearance. Gallbladder wall appears thickened and slightly irregular, question edematous. Dependent slightly higher attenuation material within the gallbladder lumen could represent sludge or faintly calcified stones, acute cholecystitis not excluded. Pancreas: Normal appearance Spleen: Small hypervascular focus inferolateral spleen 9 mm diameter question atypical hemangioma. Otherwise normal appearance Adrenals/Urinary Tract: Thickening of adrenal glands without discrete mass. BILATERAL renal cysts, largest posterior upper pole RIGHT kidney 7.8 x 6.5 cm image 32. No hydronephrosis or definite urinary tract calcifications. RIGHT ureter and bladder unremarkable. Segmental dilatation of the distal LEFT ureter without calculus or hydronephrosis. Stomach/Bowel: Proximal stomach incompletely distended unable to exclude gastric wall thickening in this setting. Small bowel loops unremarkable. Stool in rectum. Ascending colon incompletely distended limiting assessment of wall thickness. Vascular/Lymphatic: Scattered normal sized mesenteric nodes. No definite adenopathy. Atherosclerotic calcifications aorta including origins of the celiac and superior mesenteric arteries as well as both renal arteries Reproductive: Atrophic uterus and ovaries. Other: Small amount of low-attenuation free pelvic fluid. Scattered mild subcutaneous edema, nonspecific. No free intraperitoneal air or hernia. Musculoskeletal: Diffuse osseous demineralization with age-indeterminate superior endplate compression deformities of L2 and L3 vertebral bodies. IMPRESSION: Bibasilar pleural effusions atelectasis RIGHT greater than LEFT. Enlargement of cardiac chambers with a pericardial fusion. Slight  thickening of gallbladder wall with dependent density which could represent sludge or faintly calcified stones; acute cholecystitis not excluded and assessment by ultrasound recommended. BILATERAL renal cysts. Segmental dilatation of the distal LEFT ureter of uncertain etiology, without definite obstructing calculus or LEFT hydronephrosis identified. Aortic atherosclerosis with calcified plaques and narrowing of the origins of the celiac artery, SMA and renal arteries. Small amount of nonspecific free intraperitoneal fluid. Electronically Signed   By: Ulyses SouthwardMark  Boles M.D.   On: 09/11/2016 13:12   Dg Chest Port 1 View  Result Date: 09/25/2016 CLINICAL DATA:  Fever.  Altered mental status. EXAM: PORTABLE CHEST 1 VIEW COMPARISON:  09/11/2016 FINDINGS: Cardiomegaly. Improving aeration in the right base with minimal residual right base atelectasis or infiltrate. Left lung is clear. No effusions or overt edema. No acute bony abnormality. IMPRESSION: Cardiomegaly. Right lower lobe atelectasis or infiltrate, improved since prior study. Electronically Signed   By: Charlett NoseKevin  Dover M.D.   On: 09/25/2016 16:42   Dg Knee Complete 4 Views Right  Result Date: 09/03/2016 CLINICAL DATA:  Knee pain for months with no known injury. EXAM: RIGHT KNEE - COMPLETE 4+ VIEW COMPARISON:  None. FINDINGS: No acute fracture, subluxation, or joint effusion. Severe tricompartmental osteoarthritis with generalized advanced joint narrowing and bulky spurring. Osteopenia and atherosclerosis. IMPRESSION: 1. No acute finding. 2. Severe osteoarthritis. 3. Osteopenia. Electronically Signed   By: Marnee SpringJonathon  Watts M.D.   On: 09/03/2016 11:24   [place] Abdomen Limited Ruq  Result Date: 09/11/2016 CLINICAL DATA:  Acute abdominal pain for 2 days EXAM: US ABDOMEN LIMITED - RIGHT UPPER QUADRANT COMPARISON:  Abdominal CT from earlier today FINDINGS: Gallbladder: Cholelithiasis. Gallbladder wall is mildly thickened at 4 mm and there is pericholecystic fluid, but  no focal tenderness. Given the degree of pleural effusions, cardiomegaly, and IVC distension with periportal edema findings may be  secondary to passive congestion. Common bile duct: Diameter: 3 mm Liver: No focal lesion identified. Within normal limits in parenchymal echogenicity. IMPRESSION: There is cholelithiasis, gallbladder wall thickening, pericholecystic fluid but no focal tenderness typical of acute cholecystitis. Given pleural effusions, cardiomegaly, an IVC distension, gallbladder wall findings are favored reactive to passive congestion/right heart failure. Electronically Signed   By: Marnee SpringJonathon  Watts M.D.   On: 09/11/2016 15:26    Micro Results    Recent Results (from the past 240 hour(s))  Blood Culture (routine x 2)     Status: None (Preliminary result)   Collection Time: 09/25/16  4:18 PM  Result Value Ref Range Status   Specimen Description BLOOD RIGHT FOREARM  Final   Special Requests BOTTLES DRAWN AEROBIC AND ANAEROBIC AER10CC ANA7CC  Final   Culture NO GROWTH 2 DAYS  Final   Report Status PENDING  Incomplete  Urine culture     Status: None   Collection Time: 09/25/16  4:18 PM  Result Value Ref Range Status   Specimen Description URINE, RANDOM  Final   Special Requests NONE  Final   Culture NO GROWTH [facility]   Final   Report Status 09/27/2016 FINAL  Final  Blood Culture (routine x 2)     Status: None (Preliminary result)   Collection Time: 09/25/16  4:45 PM  Result Value Ref Range Status   Specimen Description BLOOD LEFT AC  Final   Special Requests BOTTLES DRAWN AEROBIC AND ANAEROBIC AER7CC ANA4CC  Final   Culture NO GROWTH 2 DAYS  Final   Report Status PENDING  Incomplete       Today   Subjective:   Clotiel Beissel today,stable for discharge. Objective:   Blood pressure (!) 133/50, pulse 62, temperature 97.5 F (36.4 C), temperature source Oral, resp. rate 17, height 5\' 4"  (1.626 m), weight 59 kg (130 lb), SpO2 99  %.   Intake/Output Summary (Last 24 hours) at 09/27/16 1244 Last data filed at 09/27/16 0903  Gross per 24 hour  Intake              360 ml  Output                0 ml  Net              360 ml    Exam Awake Oriented to name only. [place].AT,PERRAL Supple Neck,No JVD, No cervical lymphadenopathy appriciated.  Symmetrical Chest wall movement, Good air movement bilaterally, CTAB RRR,No Gallops,Rubs or new Murmurs, No Parasternal Heave +ve B.Sounds, Abd Soft, Non tender, No organomegaly appriciated, No rebound -guarding or rigidity. No Cyanosis, Clubbing or edema, No new Rash or bruise  Data Review   CBC w Diff:  Lab Results  Component Value Date   WBC 2.5 (L) 09/27/2016   HGB 11.2 (L) 09/27/2016   HCT 34.5 (L) 09/27/2016   PLT 101 (L) 09/27/2016   LYMPHOPCT 11 09/25/2016   MONOPCT 13 09/25/2016   EOSPCT 1 09/25/2016   BASOPCT 0 09/25/2016    CMP:  Lab Results  Component Value Date   NA 140 09/26/2016   K 3.8 09/26/2016   CL 110 09/26/2016   CO2 24 09/26/2016   BUN 41 (H) 09/26/2016   CREATININE 1.09 (H) 09/26/2016   PROT 8.0 09/25/2016   ALBUMIN 3.3 (L) 09/25/2016   BILITOT 1.3 (H) 09/25/2016   ALKPHOS 66 09/25/2016   AST 30 09/25/2016   ALT 11 (L) 09/25/2016  .   Total  Time in preparing paper work, data evaluation and todays exam - 35 minutes  Kandise Riehle M.D on 09/27/2016 at 12:44 PM    Note: This dictation was prepared with Dragon dictation along with smaller phrase technology. Any transcriptional errors that result from this process are unintentional.

## 2016-09-27 NOTE — Clinical Social Work Note (Signed)
Clinical Social Work Assessment  Patient Details  Name: Stefanie [name] MRN: [id number] Date of Birth: 07-10-1929  Date of referral:  09/27/16               Reason for consult:  Facility Placement                Permission sought to share information with:  Chartered certified accountant granted to share information::  Yes, Verbal Permission Granted  Name::      [place]::   [place]   Relationship::     Contact Information:     Housing/Transportation Living arrangements for the past 2 months:  [place] of Information:  Patient, Adult Children Patient Interpreter Needed:  None Criminal Activity/Legal Involvement Pertinent to Current Situation/Hospitalization:  No - Comment as needed Significant Relationships:  Adult Children Lives with:  Adult Children Do you feel safe going back to the place where you live?  Yes Need for family participation in patient care:  Yes (Comment)  Care giving concerns:  Patient lives in [place] with her daughter Stefanie [name].    Social Worker assessment / plan:  Holiday representative (CSW) received SNF consult. PT is recommending SNF. CSW met with patient at first alone at bedside. Patient was alert and oriented and was laying in the bed. Patient reported that she lives with her daughter Stefanie [name]. CSW explained that PT is recommending SNF. Patient refused SNF and reported that she is going home. CSW met with patient for a second time with her sons Stefanie [name] and Stefanie [name] present. Patient's sons were able to talk patient into going to SNF. CSW presented bed offers. Sons chose [facility]. Deborah admissions coordinator at [facility] is aware of accepted bed offer. CSW will continue to follow and assist as needed.   Employment status:  Retired Nurse, adult PT Recommendations:  [place] / Referral to community resources:  [place]  Patient/Family's Response to care:  Patient and her sons are agreeable for patient to go to [facility].   Patient/Family's Understanding of and Emotional Response to Diagnosis, Current Treatment, and Prognosis:  Patient and sons were pleasant and thanked CSW for assistance.   Emotional Assessment Appearance:  Appears stated age Attitude/Demeanor/Rapport:    Affect (typically observed):  Accepting, Adaptable, Pleasant Orientation:  Oriented to Self, Oriented to Place, Oriented to  Time, Oriented to Situation Alcohol / Substance use:  Not Applicable Psych involvement (Current and /or in the community):  No (Comment)  Discharge Needs  Concerns to be addressed:  Discharge Planning Concerns Readmission within the last 30 days:  No Current discharge risk:  Dependent with Mobility Barriers to Discharge:  Continued Medical Work up   [employer], Veronia Beets, LCSW 09/27/2016, 11:07 AM

## 2016-09-27 NOTE — Clinical Social Work Placement (Signed)
   CLINICAL SOCIAL WORK PLACEMENT  NOTE  Date:  09/27/2016  Patient Details  Name: Stefanie [name] MRN: [id number] Date of Birth: 1929-04-03  Clinical Social Work is seeking post-discharge placement for this patient at the Skilled  Nursing Facility level of care (*CSW will initial, date and re-position this form in  chart as items are completed):  Yes   Patient/family provided with [place] Clinical Social Work Department's list of facilities offering this level of care within the geographic area requested by the patient (or if unable, by the patient's family).  Yes   Patient/family informed of their freedom to choose among providers that offer the needed level of care, that participate in Medicare, Medicaid or managed care program needed by the patient, have an available bed and are willing to accept the patient.  Yes   Patient/family informed of [place]'s ownership interest in [facility] and [facility], as well as of the fact that they are under no obligation to receive care at these facilities.  PASRR submitted to EDS on       PASRR number received on       Existing PASRR number confirmed on 09/27/16     FL2 transmitted to all facilities in geographic area requested by pt/family on 09/27/16     FL2 transmitted to all facilities within larger geographic area on       Patient informed that his/her managed care company has contracts with or will negotiate with certain facilities, including the following:        Yes   Patient/family informed of bed offers received.  Patient chooses bed at  [facility]     Physician recommends and patient chooses bed at      Patient to be transferred to   on  .  Patient to be transferred to facility by       Patient family notified on   of transfer.  Name of family member notified:        PHYSICIAN       Additional Comment:    _______________________________________________ [name], Darleen CrockerBailey M, LCSW 09/27/2016, 11:06  AM

## 2016-09-27 NOTE — Clinical Social Work Placement (Signed)
   CLINICAL SOCIAL WORK PLACEMENT  NOTE  Date:  09/27/2016  Patient Details  Name: Stefanie [name] MRN: [id number] Date of Birth: June 25, 1929  Clinical Social Work is seeking post-discharge placement for this patient at the Skilled  Nursing Facility level of care (*CSW will initial, date and re-position this form in  chart as items are completed):  Yes   Patient/family provided with [place] Clinical Social Work Department's list of facilities offering this level of care within the geographic area requested by the patient (or if unable, by the patient's family).  Yes   Patient/family informed of their freedom to choose among providers that offer the needed level of care, that participate in Medicare, Medicaid or managed care program needed by the patient, have an available bed and are willing to accept the patient.  Yes   Patient/family informed of [facility]'s ownership interest in [facility] and [facility], as well as of the fact that they are under no obligation to receive care at these facilities.  PASRR submitted to EDS on       PASRR number received on       Existing PASRR number confirmed on 09/27/16     FL2 transmitted to all facilities in geographic area requested by pt/family on 09/27/16     FL2 transmitted to all facilities within larger geographic area on       Patient informed that his/her managed care company has contracts with or will negotiate with certain facilities, including the following:        Yes   Patient/family informed of bed offers received.  Patient chooses bed at  [facility] )     Physician recommends and patient chooses bed at      Patient to be transferred to  [facility] ) on 09/27/16.  Patient to be transferred to facility by  [facility] )     Patient family notified on 09/27/16 of transfer.  Name of family member notified:   (Patient's son [name] is aware of D/C today. )     PHYSICIAN       Additional  Comment:    _______________________________________________ [name], Darleen CrockerBailey M, LCSW 09/27/2016, 2:32 PM

## 2016-09-27 NOTE — Discharge Instructions (Signed)
Skin care as routine,  use barrier ointment to the bottom.

## 2016-09-27 NOTE — Progress Notes (Signed)
Patient is medically stable for [facility]. Per Gavin Poundeborah admissions coordinator [facility] patient will [facility] to room 223-A. RN will call report to [facility] and arrange EMS for transport. Clinical Child psychotherapistocial Worker (CSW) sent D/C orders to [employer] via [employer]. Patient is aware of above. CSW contacted patient's son [name] and made him aware of above. Per son he has [facility]. Please reconsult if future social work needs arise. CSW signing off.   [employer]iley Imaan Padgett, LCSW [phone]

## 2016-09-27 NOTE — Progress Notes (Signed)
Ems Called and patient prepped for transport. IV still left in patient until transport arrives on campus.  Update given and patient to be monitored by charge RN until departure from facility.

## 2016-09-28 LAB — LEVETIRACETAM LEVEL: LEVETIRACETAM: 36.1 ug/mL (ref 10.0–40.0)

## 2016-09-29 LAB — BLOOD GAS, VENOUS
ACID-BASE EXCESS: 1.1 mmol/L (ref 0.0–2.0)
Bicarbonate: 26.6 mmol/L (ref 20.0–28.0)
PATIENT TEMPERATURE: 37
PH VEN: 7.38 (ref 7.250–7.430)
pCO2, Ven: 45 mmHg (ref 44.0–60.0)

## 2016-09-30 LAB — CULTURE, BLOOD (ROUTINE X 2)
CULTURE: NO GROWTH
Culture: NO GROWTH

## 2017-07-10 IMAGING — CT CT HEAD W/O CM
3 series · 14 of 46 positions shown, 16 images · non-contrast
Comparison: CT scan of September 11, 2016.

CLINICAL DATA: Altered mental status.

EXAM:
CT HEAD WITHOUT CONTRAST
TECHNIQUE: Contiguous axial images were obtained from the base of the skull
through the vertex without intravenous contrast.

[Series 2: head wo · axial · 0.40mm/px · z∈[-190,-70]mm · 8 of 29 slices shown, 10 images]
[im 3/29  brain]
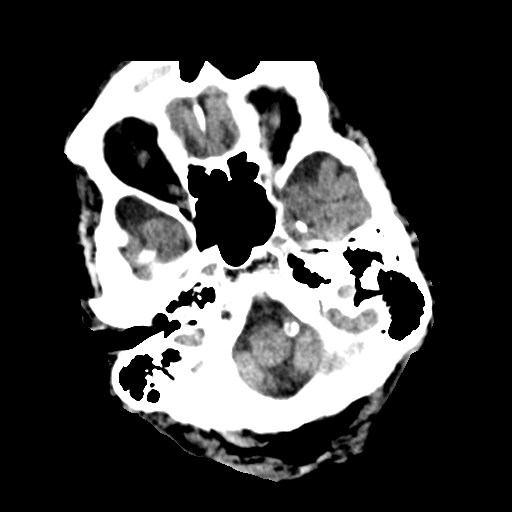
[im 3/29  bone]
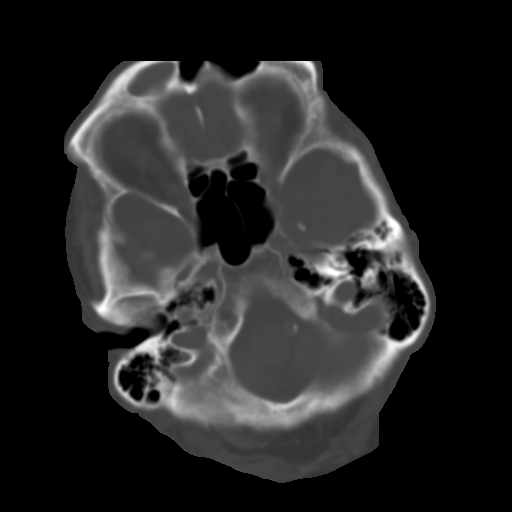
[im 7/29  brain]
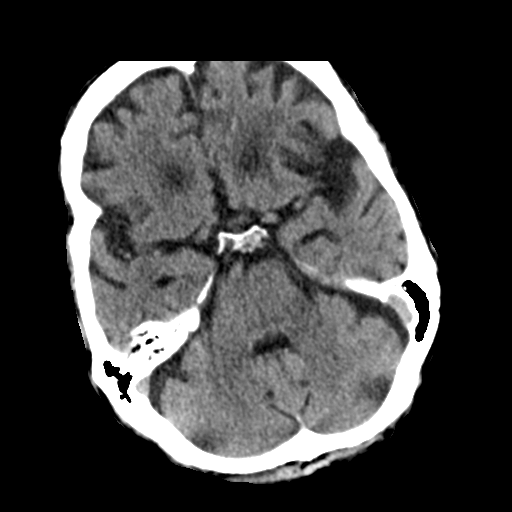
[im 10/29  brain]
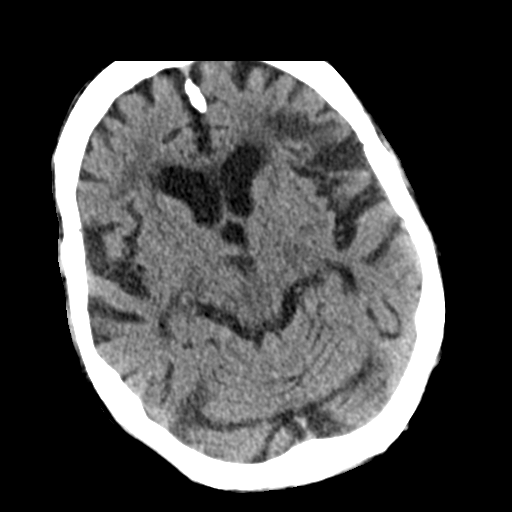
[im 13/29  brain]
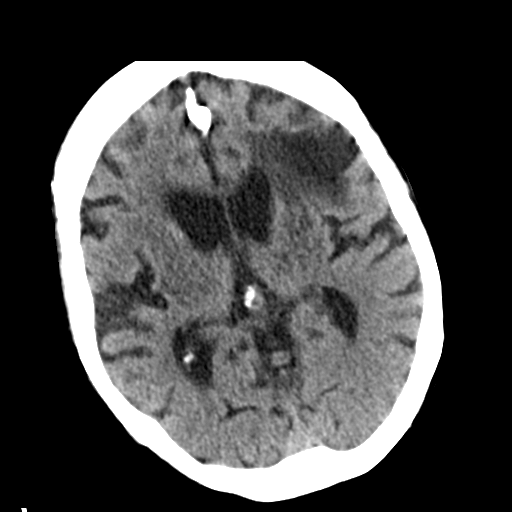
[im 17/29  brain]
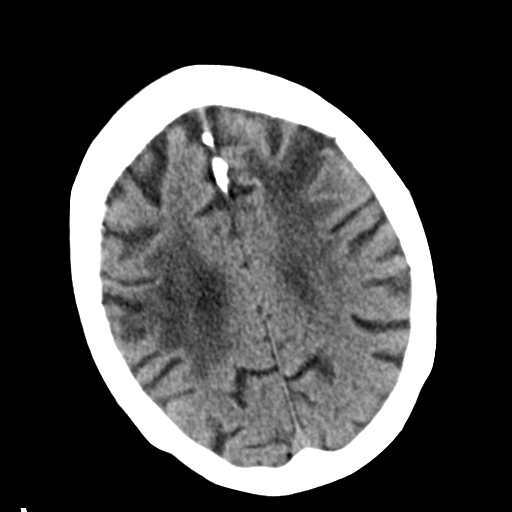
[im 17/29  bone]
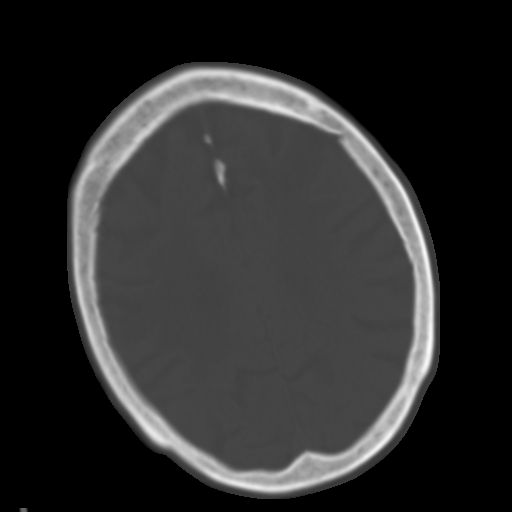
[im 20/29  brain]
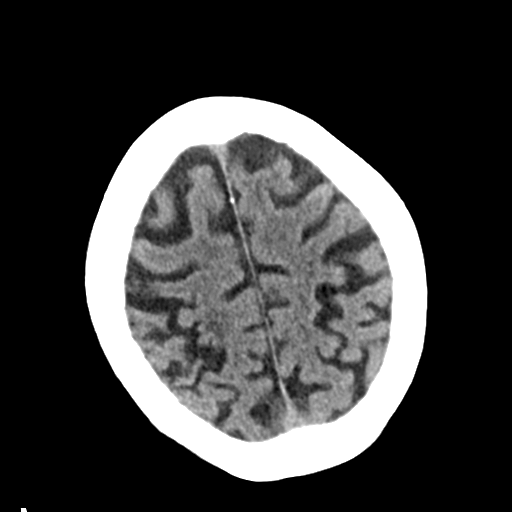
[im 23/29  brain]
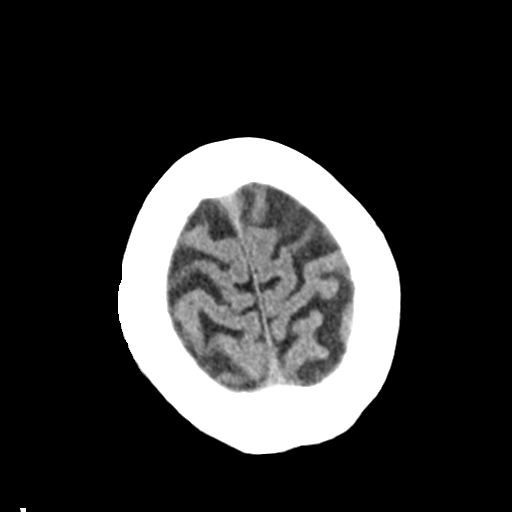
[im 27/29  brain]
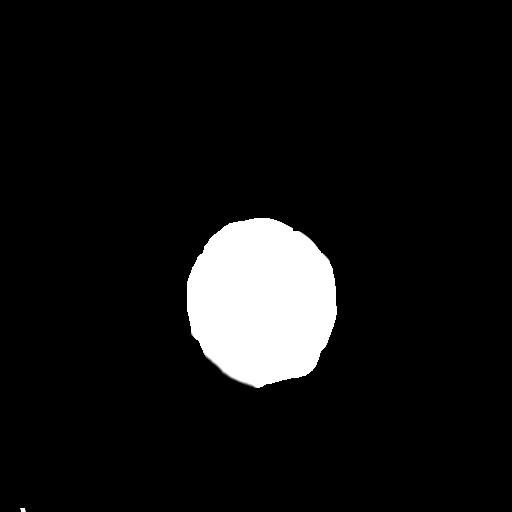

[Series 4: coronal soft tissue · coronal · 0.28mm/px · 3 of 63 slices shown]
[im 21/63  brain]
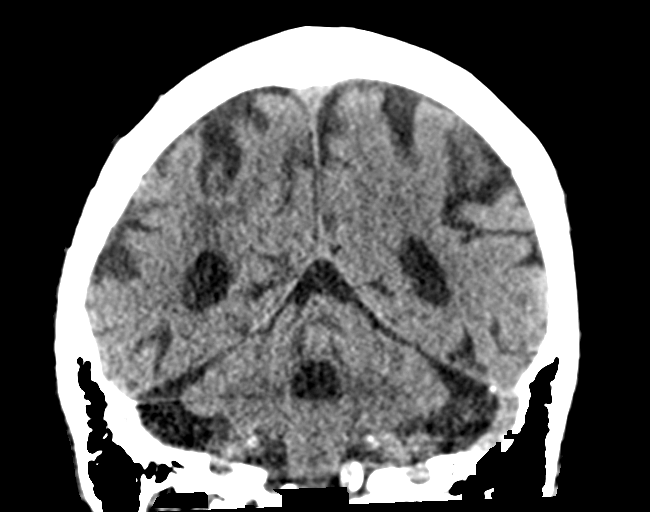
[im 28/63  brain]
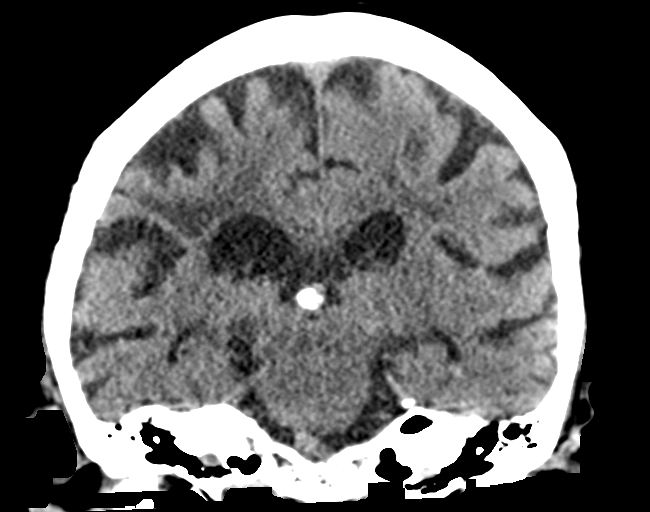
[im 35/63  brain]
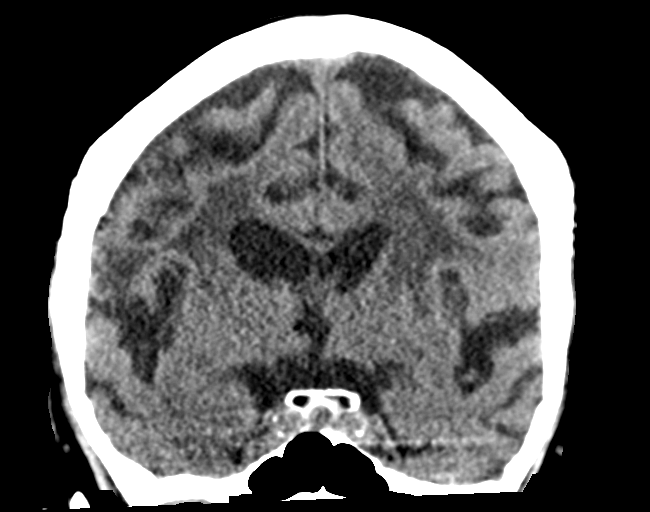

[Series 5: sagittal soft tissue · sagittal · 0.28mm/px · 3 of 50 slices shown]
[im 17/50  brain]
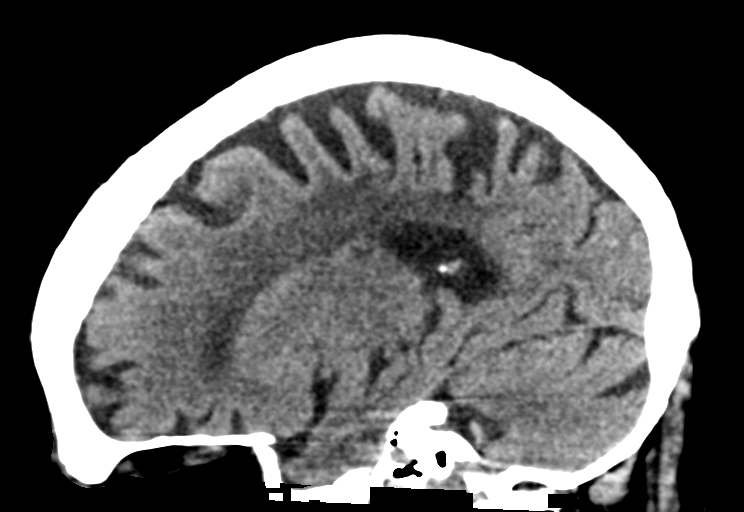
[im 25/50  brain]
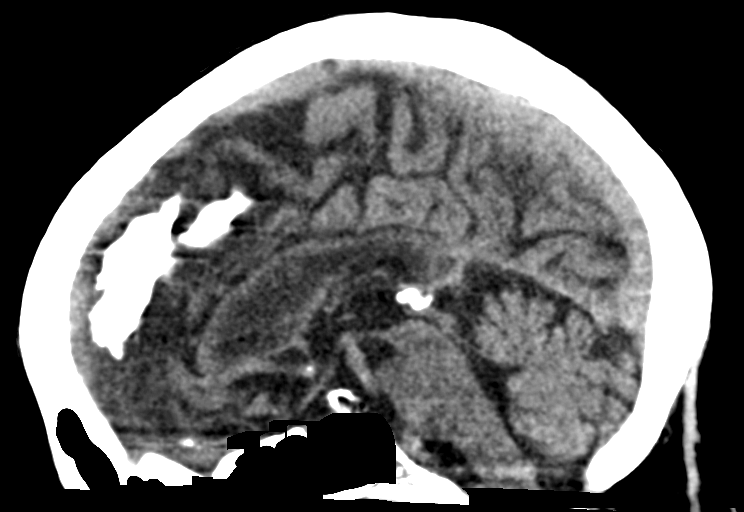
[im 33/50  brain]
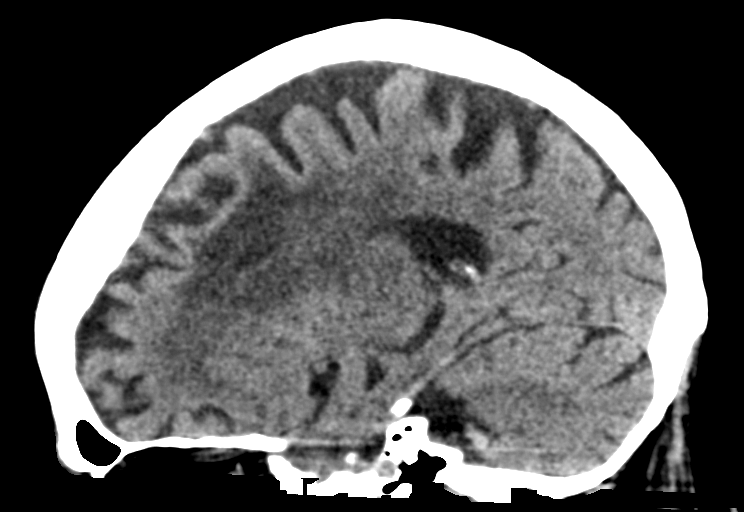

[14 of 46 positions shown; findings below may reference images not displayed]

FINDINGS: Brain: Mild diffuse cortical atrophy is noted. Old bilateral frontal
lobe infarctions are noted. Mild chronic ischemic white matter
disease is noted. No mass effect or midline shift is noted.
Ventricular size is within normal limits. There is no evidence of
mass lesion, hemorrhage or acute infarction.

Vascular: Atherosclerosis carotid siphons and vertebral arteries is
noted.

Skull: Normal. Negative for fracture or focal lesion.

Sinuses/Orbits: Opacification of right frontal sinus is noted.

Other: None.
IMPRESSION: Mild diffuse cortical atrophy. Old bilateral frontal infarctions. No
acute intracranial abnormality seen.

## 2019-04-15 ENCOUNTER — Other Ambulatory Visit (HOSPITAL_COMMUNITY): Payer: Self-pay | Admitting: Family Medicine

## 2019-04-20 LAB — NOVEL CORONAVIRUS, NAA: SARS-CoV-2, NAA: NOT DETECTED

## 2019-04-22 ENCOUNTER — Other Ambulatory Visit: Payer: Self-pay | Admitting: Family Medicine

## 2019-04-29 LAB — NOVEL CORONAVIRUS, NAA: SARS-CoV-2, NAA: NOT DETECTED

## 2020-05-02 DIAGNOSIS — M1990 Unspecified osteoarthritis, unspecified site: Secondary | ICD-10-CM | POA: Insufficient documentation

## 2020-06-20 ENCOUNTER — Encounter: Payer: Self-pay | Admitting: *Deleted

## 2020-06-20 ENCOUNTER — Other Ambulatory Visit: Payer: Self-pay | Admitting: *Deleted

## 2020-06-21 ENCOUNTER — Inpatient Hospital Stay: Payer: Medicare Other | Attending: Internal Medicine | Admitting: Internal Medicine

## 2020-06-21 ENCOUNTER — Other Ambulatory Visit: Payer: Self-pay

## 2020-06-21 ENCOUNTER — Inpatient Hospital Stay: Payer: Medicare Other

## 2020-06-21 DIAGNOSIS — E039 Hypothyroidism, unspecified: Secondary | ICD-10-CM | POA: Diagnosis not present

## 2020-06-21 DIAGNOSIS — R160 Hepatomegaly, not elsewhere classified: Secondary | ICD-10-CM | POA: Diagnosis not present

## 2020-06-21 DIAGNOSIS — F0390 Unspecified dementia without behavioral disturbance: Secondary | ICD-10-CM | POA: Diagnosis not present

## 2020-06-21 DIAGNOSIS — Z87442 Personal history of urinary calculi: Secondary | ICD-10-CM | POA: Insufficient documentation

## 2020-06-21 DIAGNOSIS — G4700 Insomnia, unspecified: Secondary | ICD-10-CM | POA: Diagnosis not present

## 2020-06-21 DIAGNOSIS — Z8249 Family history of ischemic heart disease and other diseases of the circulatory system: Secondary | ICD-10-CM

## 2020-06-21 DIAGNOSIS — Z87891 Personal history of nicotine dependence: Secondary | ICD-10-CM | POA: Insufficient documentation

## 2020-06-21 DIAGNOSIS — I4891 Unspecified atrial fibrillation: Secondary | ICD-10-CM | POA: Diagnosis not present

## 2020-06-21 DIAGNOSIS — Z79899 Other long term (current) drug therapy: Secondary | ICD-10-CM | POA: Insufficient documentation

## 2020-06-21 DIAGNOSIS — D649 Anemia, unspecified: Secondary | ICD-10-CM | POA: Diagnosis not present

## 2020-06-21 DIAGNOSIS — D696 Thrombocytopenia, unspecified: Secondary | ICD-10-CM

## 2020-06-21 DIAGNOSIS — Z7901 Long term (current) use of anticoagulants: Secondary | ICD-10-CM | POA: Insufficient documentation

## 2020-06-21 DIAGNOSIS — I10 Essential (primary) hypertension: Secondary | ICD-10-CM | POA: Diagnosis not present

## 2020-06-21 LAB — CBC WITH DIFFERENTIAL/PLATELET
Abs Immature Granulocytes: 0 10*3/uL (ref 0.00–0.07)
Basophils Absolute: 0.1 10*3/uL (ref 0.0–0.1)
Basophils Relative: 3 %
Eosinophils Absolute: 0.3 10*3/uL (ref 0.0–0.5)
Eosinophils Relative: 7 %
HCT: 27.4 % — ABNORMAL LOW (ref 36.0–46.0)
Hemoglobin: 9 g/dL — ABNORMAL LOW (ref 12.0–15.0)
Lymphocytes Relative: 22 %
Lymphs Abs: 0.9 10*3/uL (ref 0.7–4.0)
MCH: 31.4 pg (ref 26.0–34.0)
MCHC: 32.8 g/dL (ref 30.0–36.0)
MCV: 95.5 fL (ref 80.0–100.0)
Monocytes Absolute: 0.8 10*3/uL (ref 0.1–1.0)
Monocytes Relative: 21 %
Myelocytes: 1 %
Neutro Abs: 1.8 10*3/uL (ref 1.7–7.7)
Neutrophils Relative %: 46 %
Platelets: 70 10*3/uL — ABNORMAL LOW (ref 150–400)
RBC: 2.87 MIL/uL — ABNORMAL LOW (ref 3.87–5.11)
RDW: 17.4 % — ABNORMAL HIGH (ref 11.5–15.5)
Smear Review: DECREASED
WBC: 4 10*3/uL (ref 4.0–10.5)
nRBC: 0 % (ref 0.0–0.2)

## 2020-06-21 LAB — COMPREHENSIVE METABOLIC PANEL
ALT: 9 U/L (ref 0–44)
AST: 15 U/L (ref 15–41)
Albumin: 3.7 g/dL (ref 3.5–5.0)
Alkaline Phosphatase: 96 U/L (ref 38–126)
Anion gap: 10 (ref 5–15)
BUN: 45 mg/dL — ABNORMAL HIGH (ref 8–23)
CO2: 25 mmol/L (ref 22–32)
Calcium: 8.7 mg/dL — ABNORMAL LOW (ref 8.9–10.3)
Chloride: 106 mmol/L (ref 98–111)
Creatinine, Ser: 1.18 mg/dL — ABNORMAL HIGH (ref 0.44–1.00)
GFR calc Af Amer: 47 mL/min — ABNORMAL LOW (ref >60)
GFR calc non Af Amer: 41 mL/min — ABNORMAL LOW (ref >60)
Glucose, Bld: 109 mg/dL — ABNORMAL HIGH (ref 70–99)
Potassium: 3.9 mmol/L (ref 3.5–5.1)
Sodium: 141 mmol/L (ref 135–145)
Total Bilirubin: 0.7 mg/dL (ref 0.3–1.2)
Total Protein: 8.3 g/dL — ABNORMAL HIGH (ref 6.5–8.1)

## 2020-06-21 LAB — RETICULOCYTES
Immature Retic Fract: 12 % (ref 2.3–15.9)
RBC.: 2.86 MIL/uL — ABNORMAL LOW (ref 3.87–5.11)
Retic Count, Absolute: 54.6 10*3/uL (ref 19.0–186.0)
Retic Ct Pct: 1.9 % (ref 0.4–3.1)

## 2020-06-21 LAB — IRON AND TIBC
Iron: 51 ug/dL (ref 28–170)
Saturation Ratios: 21 % (ref 10.4–31.8)
TIBC: 246 ug/dL — ABNORMAL LOW (ref 250–450)
UIBC: 195 ug/dL

## 2020-06-21 LAB — HEPATITIS C ANTIBODY: HCV Ab: REACTIVE — AB

## 2020-06-21 LAB — TECHNOLOGIST SMEAR REVIEW: Plt Morphology: DECREASED

## 2020-06-21 LAB — FOLATE: Folate: 27 ng/mL (ref >5.9)

## 2020-06-21 LAB — FERRITIN: Ferritin: 40 ng/mL (ref 11–307)

## 2020-06-21 LAB — HEPATITIS B SURFACE ANTIGEN: Hepatitis B Surface Ag: NONREACTIVE

## 2020-06-21 LAB — VITAMIN B12: Vitamin B-12: 83 pg/mL — ABNORMAL LOW (ref 180–914)

## 2020-06-21 LAB — LACTATE DEHYDROGENASE: LDH: 103 U/L (ref 98–192)

## 2020-06-21 NOTE — Progress Notes (Signed)
[place] NOTE  Patient Care Team: Petra Kuba, MD (Inactive) as PCP - General (Family Medicine)  CHIEF COMPLAINTS/PURPOSE OF CONSULTATION: Anemia/thrombocytopenia   HEMATOLOGY HISTORY  # ANEMIA/ THROMBOCYTOPENIA [at least since 2017- Hb 11/110; AUG 2021-  WBC-3.7;ANC-1.3;Hb-9.6; MCV- 93; platelets-70]  #A. fib on Eliquis; history of seizures on Keppra; peripheral neuropathy.  HISTORY OF PRESENTING ILLNESS: POOR HISTORIAN/dementia Stefanie [name] 86 y.o.  female patient with multiple medical problems including dementia, history of seizures was been referred to [place] for further evaluation of anemia/thrombocytopenia.  Review of records-summarized above show hemoglobin on 11 and platelets around 110 in 2017.  More recently in August 2021 hemoglobin is 9.6 platelets 70s.  Patient has been referred to [place] for further evaluation and recommendations.  Patient is not a reliable historian given age/dementia.  In fact patient is not sure why she is in the clinic.  Review of Systems  Unable to perform ROS: Age     MEDICAL HISTORY:  Past Medical History:  Diagnosis Date  . A-fib ([place])   . Acute dermatitis   . Acute eczematoid otitis externa of left ear   . Acute renal failure ([place])   . Allergic rhinitis   . Anemia   . Herpetic vulvovaginitis   . History of kidney stones   . Hypertension   . Hypothyroidism, adult   . Insomnia, unspecified   . Left nephrolithiasis   . Osteoarthritis   . Polyneuropathy   . Seizure ([place])   . Vitamin D deficiency     SURGICAL HISTORY: Past Surgical History:  Procedure Laterality Date  . none      SOCIAL HISTORY: Social History   Socioeconomic History  . Marital status: Widowed    Spouse name: Not on file  . Number of children: Not on file  . Years of education: Not on file  . Highest education level: Not on file  Occupational History  . Not on file  Tobacco Use  . Smoking status: Former Research scientist (life sciences)  . Smokeless  tobacco: Never Used  Substance and Sexual Activity  . Alcohol use: No  . Drug use: No  . Sexual activity: Not on file  Other Topics Concern  . Not on file  Social History Narrative  . Not on file   Social Determinants of Health   Financial Resource Strain:   . Difficulty of Paying Living Expenses: Not on file  Food Insecurity:   . Worried About Charity fundraiser in the Last Year: Not on file  . Ran Out of Food in the Last Year: Not on file  Transportation Needs:   . Lack of Transportation (Medical): Not on file  . Lack of Transportation (Non-Medical): Not on file  Physical Activity:   . Days of Exercise per Week: Not on file  . Minutes of Exercise per Session: Not on file  Stress:   . Feeling of Stress : Not on file  Social Connections:   . Frequency of Communication with Friends and Family: Not on file  . Frequency of Social Gatherings with Friends and Family: Not on file  . Attends Religious Services: Not on file  . Active Member of Clubs or Organizations: Not on file  . Attends Archivist Meetings: Not on file  . Marital Status: Not on file  Intimate Partner Violence:   . Fear of Current or Ex-Partner: Not on file  . Emotionally Abused: Not on file  . Physically Abused: Not on file  . Sexually  Abused: Not on file    FAMILY HISTORY: Family History  Problem Relation Age of Onset  . Hypertension Mother     ALLERGIES:  is allergic to naproxen and ace inhibitors.  MEDICATIONS:  Current Outpatient Medications  Medication Sig Dispense Refill  . Acetaminophen 500 MG capsule Take 650 mg by mouth in the morning and at bedtime.     Marland Kitchen acyclovir (ZOVIRAX) 400 MG tablet Take 400 mg by mouth daily.    Marland Kitchen gabapentin (NEURONTIN) 100 MG capsule Take 100 mg by mouth 2 (two) times daily.    . hydrochlorothiazide (MICROZIDE) 12.5 MG capsule Take 12.5 mg by mouth daily.    . hydrocortisone 2.5 % cream Apply topically.    . levETIRAcetam (KEPPRA) 500 MG tablet Take 500  mg by mouth 2 (two) times daily.    Marland Kitchen levothyroxine (SYNTHROID) 100 MCG tablet Take 1 tablet by mouth daily.    . melatonin 5 MG TABS Take 5 mg by mouth at bedtime.    Marland Kitchen albuterol (VENTOLIN HFA) 108 (90 Base) MCG/ACT inhaler Inhale into the lungs. (Patient not taking: Reported on 06/21/2020)    . apixaban (ELIQUIS) 2.5 MG TABS tablet Take by mouth. (Patient not taking: Reported on 06/21/2020)    . losartan (COZAAR) 100 MG tablet Take 1 tablet by mouth daily.    . traZODone (DESYREL) 50 MG tablet Take 25 mg by mouth at bedtime as needed for sleep. (Patient not taking: Reported on 06/21/2020)    . valACYclovir (VALTREX) 1000 MG tablet Take 1,000 mg by mouth 2 (two) times daily.     No current facility-administered medications for this visit.     Marland Kitchen  PHYSICAL EXAMINATION:   Vitals:   06/21/20 1119  BP: (!) 123/52  Pulse: 76  Temp: 97.7 F (36.5 C)  SpO2: 100%   Filed Weights   06/21/20 1119  Weight: 150 lb (68 kg)    Physical Exam Constitutional:      Comments: Frail-appearing Caucasian female patient.  She is resting in a wheelchair.  Accompanied by transporter  HENT:     Head: Normocephalic and atraumatic.     Mouth/Throat:     Pharynx: No oropharyngeal exudate.  Cardiovascular:     Rate and Rhythm: Normal rate and regular rhythm.  Pulmonary:     Effort: No respiratory distress.     Breath sounds: No wheezing.     Comments: Decreased bilateral air entry. Abdominal:     General: Bowel sounds are normal. There is no distension.     Palpations: Abdomen is soft. There is no mass.     Tenderness: There is no abdominal tenderness. There is no guarding or rebound.  Musculoskeletal:        General: No tenderness. Normal range of motion.     Cervical back: Normal range of motion and neck supple.  Skin:    General: Skin is warm.  Neurological:     Mental Status: She is alert.     Comments: Oriented x2-3  Psychiatric:        Mood and Affect: Affect normal.      LABORATORY  DATA:  I have reviewed the data as listed Lab Results  Component Value Date   WBC 2.5 (L) 09/27/2016   HGB 11.2 (L) 09/27/2016   HCT 34.5 (L) 09/27/2016   MCV 88.1 09/27/2016   PLT 101 (L) 09/27/2016   No results for input(s): NA, K, CL, CO2, GLUCOSE, BUN, CREATININE, CALCIUM, GFRNONAA, GFRAA, PROT, ALBUMIN, AST, ALT,  ALKPHOS, BILITOT, BILIDIR, IBILI in the last 8760 hours.   No results found.  ASSESSMENT & PLAN:   Normocytic anemia #Anemia/thrombocytopenia-normocytic ~hemoglobin 9; platelets~70; mild neutropenia ANC 1.3-the etiology is unclear peripheral/liver dysfunction versus primary bone marrow problem.   #Recommend checking CBC CMP LDH myeloma work-up M76 folic acid; iron studies ferritin hepatitis work-up  #Dementia/history of seizures/frailty/-complicating patient's work-up including possible need for bone marrow biopsy..  Thank you Dr. Geri Seminole for allowing me to participate in the care of your pleasant patient. Please do not hesitate to contact me with questions or concerns in the interim.  # DISPOSITION: # labs today- ordered # Follow up- in [place] next week; MD; no labs-Ultrasound abdomen prior-Dr.B  Dr.Slade-Hartman, Altamese Cabal, MD 06/21/2020 12:02 PM

## 2020-06-21 NOTE — Assessment & Plan Note (Addendum)
#  Anemia/thrombocytopenia-normocytic ~hemoglobin 9; platelets~70; mild neutropenia ANC 1.3-the etiology is unclear peripheral/liver dysfunction versus primary bone marrow problem.   #Recommend checking CBC CMP LDH myeloma work-up Z56 folic acid; iron studies ferritin hepatitis work-up  #Dementia/history of seizures/frailty/-complicating patient's work-up including possible need for bone marrow biopsy..  Thank you Dr. Geri Seminole for allowing me to participate in the care of your pleasant patient. Please do not hesitate to contact me with questions or concerns in the interim.  # DISPOSITION: # labs today- ordered # Follow up- in [place] next week; MD; no labs-Ultrasound abdomen prior-Dr.B  Dr.Slade-Hartman, Ivette Loyal

## 2020-06-22 LAB — HAPTOGLOBIN: Haptoglobin: 84 mg/dL (ref 41–333)

## 2020-06-22 LAB — KAPPA/LAMBDA LIGHT CHAINS
Kappa free light chain: 144.4 mg/L — ABNORMAL HIGH (ref 3.3–19.4)
Kappa, lambda light chain ratio: 2.01 — ABNORMAL HIGH (ref 0.26–1.65)
Lambda free light chains: 72 mg/L — ABNORMAL HIGH (ref 5.7–26.3)

## 2020-06-23 LAB — MULTIPLE MYELOMA PANEL, SERUM
Albumin SerPl Elph-Mcnc: 3.5 g/dL (ref 2.9–4.4)
Albumin/Glob SerPl: 0.9 (ref 0.7–1.7)
Alpha 1: 0.2 g/dL (ref 0.0–0.4)
Alpha2 Glob SerPl Elph-Mcnc: 0.5 g/dL (ref 0.4–1.0)
B-Globulin SerPl Elph-Mcnc: 0.8 g/dL (ref 0.7–1.3)
Gamma Glob SerPl Elph-Mcnc: 2.7 g/dL — ABNORMAL HIGH (ref 0.4–1.8)
Globulin, Total: 4.3 g/dL — ABNORMAL HIGH (ref 2.2–3.9)
IgA: 553 mg/dL — ABNORMAL HIGH (ref 64–422)
IgG (Immunoglobin G), Serum: 2623 mg/dL — ABNORMAL HIGH (ref 586–1602)
IgM (Immunoglobulin M), Srm: 180 mg/dL (ref 26–217)
Total Protein ELP: 7.8 g/dL (ref 6.0–8.5)

## 2020-06-24 ENCOUNTER — Ambulatory Visit: Payer: Medicare Other

## 2020-06-29 ENCOUNTER — Ambulatory Visit
Admission: RE | Admit: 2020-06-29 | Discharge: 2020-06-29 | Disposition: A | Discharge status: Home / Self Care | Payer: Medicare Other | Source: Ambulatory Visit | Attending: Internal Medicine | Admitting: Internal Medicine

## 2020-06-29 ENCOUNTER — Ambulatory Visit: Payer: Medicare Other | Admitting: Internal Medicine

## 2020-06-29 ENCOUNTER — Other Ambulatory Visit: Payer: Self-pay

## 2020-06-29 DIAGNOSIS — D696 Thrombocytopenia, unspecified: Secondary | ICD-10-CM | POA: Diagnosis not present

## 2020-06-29 DIAGNOSIS — R160 Hepatomegaly, not elsewhere classified: Secondary | ICD-10-CM

## 2020-07-04 ENCOUNTER — Inpatient Hospital Stay: Payer: Medicare Other | Attending: Internal Medicine | Admitting: Internal Medicine

## 2020-07-04 ENCOUNTER — Other Ambulatory Visit: Payer: Self-pay

## 2020-07-04 ENCOUNTER — Encounter: Payer: Self-pay | Admitting: Internal Medicine

## 2020-07-04 DIAGNOSIS — I10 Essential (primary) hypertension: Secondary | ICD-10-CM | POA: Insufficient documentation

## 2020-07-04 DIAGNOSIS — D649 Anemia, unspecified: Secondary | ICD-10-CM | POA: Insufficient documentation

## 2020-07-04 DIAGNOSIS — R160 Hepatomegaly, not elsewhere classified: Secondary | ICD-10-CM | POA: Diagnosis not present

## 2020-07-04 DIAGNOSIS — K8689 Other specified diseases of pancreas: Secondary | ICD-10-CM | POA: Insufficient documentation

## 2020-07-04 DIAGNOSIS — D696 Thrombocytopenia, unspecified: Secondary | ICD-10-CM | POA: Insufficient documentation

## 2020-07-04 DIAGNOSIS — Z87442 Personal history of urinary calculi: Secondary | ICD-10-CM | POA: Diagnosis not present

## 2020-07-04 DIAGNOSIS — I4891 Unspecified atrial fibrillation: Secondary | ICD-10-CM | POA: Insufficient documentation

## 2020-07-04 DIAGNOSIS — F0390 Unspecified dementia without behavioral disturbance: Secondary | ICD-10-CM | POA: Diagnosis not present

## 2020-07-04 DIAGNOSIS — E538 Deficiency of other specified B group vitamins: Secondary | ICD-10-CM | POA: Insufficient documentation

## 2020-07-04 DIAGNOSIS — G4700 Insomnia, unspecified: Secondary | ICD-10-CM | POA: Diagnosis not present

## 2020-07-04 DIAGNOSIS — E039 Hypothyroidism, unspecified: Secondary | ICD-10-CM | POA: Insufficient documentation

## 2020-07-04 DIAGNOSIS — Z87891 Personal history of nicotine dependence: Secondary | ICD-10-CM | POA: Insufficient documentation

## 2020-07-04 DIAGNOSIS — Z79899 Other long term (current) drug therapy: Secondary | ICD-10-CM | POA: Insufficient documentation

## 2020-07-04 DIAGNOSIS — Z7901 Long term (current) use of anticoagulants: Secondary | ICD-10-CM | POA: Insufficient documentation

## 2020-07-04 NOTE — Progress Notes (Signed)
[place] NOTE  Patient Care Team: Petra Kuba, MD (Inactive) as PCP - General (Family Medicine)  CHIEF COMPLAINTS/PURPOSE OF CONSULTATION: Anemia/thrombocytopenia   HEMATOLOGY HISTORY  # ANEMIA/ THROMBOCYTOPENIA [at least since 2017- Hb 11/110; AUG 2021-  WBC-3.7;ANC-1.3;Hb-9.6; MCV- 93; platelets-70]; SEP [place] - There is a hypoechoic lesion measuring 2.8 x 1.6 x 3.0 cm adjacent to the head and body of the pancreas. It is uncertain whether this lies within the pancreas or adjacent to the pancreas.  # B12 deficiency-September [id number] injections facility.Marland Kitchen   #A. fib on Eliquis; history of seizures on Keppra; peripheral neuropathy.; dementia/[facility] [place] [facility]  HISTORY OF PRESENTING ILLNESS: POOR HISTORIAN/dementia Stefanie [name] 86 y.o.  female patient with multiple medical problems including dementia, history of seizures-with worsening anemia/thrombocytopenia is here to review the recent work-up including ultrasound.  Patient is not a reliable historian given her age and dementia.  Denies any pain.  No bleeding.  Review of Systems  Unable to perform ROS: Age     MEDICAL HISTORY:  Past Medical History:  Diagnosis Date  . A-fib ([place])   . Acute dermatitis   . Acute eczematoid otitis externa of left ear   . Acute renal failure ([employer])   . Allergic rhinitis   . Anemia   . Herpetic vulvovaginitis   . History of kidney stones   . Hypertension   . Hypothyroidism, adult   . Insomnia, unspecified   . Left nephrolithiasis   . Osteoarthritis   . Polyneuropathy   . Seizure ([place])   . Vitamin D deficiency     SURGICAL HISTORY: Past Surgical History:  Procedure Laterality Date  . none      SOCIAL HISTORY: Social History   Socioeconomic History  . Marital status: Widowed    Spouse name: Not on file  . Number of children: Not on file  . Years of education: Not on file  . Highest education level: Not on file  Occupational History  . Not on file   Tobacco Use  . Smoking status: Former Research scientist (life sciences)  . Smokeless tobacco: Never Used  Substance and Sexual Activity  . Alcohol use: No  . Drug use: No  . Sexual activity: Not on file  Other Topics Concern  . Not on file  Social History Narrative  . Not on file   Social Determinants of Health   Financial Resource Strain:   . Difficulty of Paying Living Expenses: Not on file  Food Insecurity:   . Worried About Charity fundraiser in the Last Year: Not on file  . Ran Out of Food in the Last Year: Not on file  Transportation Needs:   . Lack of Transportation (Medical): Not on file  . Lack of Transportation (Non-Medical): Not on file  Physical Activity:   . Days of Exercise per Week: Not on file  . Minutes of Exercise per Session: Not on file  Stress:   . Feeling of Stress : Not on file  Social Connections:   . Frequency of Communication with Friends and Family: Not on file  . Frequency of Social Gatherings with Friends and Family: Not on file  . Attends Religious Services: Not on file  . Active Member of Clubs or Organizations: Not on file  . Attends Archivist Meetings: Not on file  . Marital Status: Not on file  Intimate Partner Violence:   . Fear of Current or Ex-Partner: Not on file  . Emotionally Abused: Not on file  .  Physically Abused: Not on file  . Sexually Abused: Not on file    FAMILY HISTORY: Family History  Problem Relation Age of Onset  . Hypertension Mother     ALLERGIES:  is allergic to naproxen and ace inhibitors.  MEDICATIONS:  Current Outpatient Medications  Medication Sig Dispense Refill  . Acetaminophen 500 MG capsule Take 650 mg by mouth in the morning and at bedtime.     Marland Kitchen acyclovir (ZOVIRAX) 400 MG tablet Take 400 mg by mouth daily.    Marland Kitchen gabapentin (NEURONTIN) 100 MG capsule Take 100 mg by mouth 2 (two) times daily.    . hydrochlorothiazide (MICROZIDE) 12.5 MG capsule Take 12.5 mg by mouth daily.    . hydrocortisone 2.5 % cream Apply  topically.    . levETIRAcetam (KEPPRA) 500 MG tablet Take 500 mg by mouth 2 (two) times daily.    Marland Kitchen levothyroxine (SYNTHROID) 100 MCG tablet Take 1 tablet by mouth daily.    Marland Kitchen losartan (COZAAR) 100 MG tablet Take 1 tablet by mouth daily.    . melatonin 5 MG TABS Take 5 mg by mouth at bedtime.    . valACYclovir (VALTREX) 1000 MG tablet Take 1,000 mg by mouth 2 (two) times daily.    Marland Kitchen albuterol (VENTOLIN HFA) 108 (90 Base) MCG/ACT inhaler Inhale into the lungs. (Patient not taking: Reported on 06/21/2020)    . apixaban (ELIQUIS) 2.5 MG TABS tablet Take by mouth. (Patient not taking: Reported on 06/21/2020)    . traZODone (DESYREL) 50 MG tablet Take 25 mg by mouth at bedtime as needed for sleep. (Patient not taking: Reported on 06/21/2020)     No current facility-administered medications for this visit.     Marland Kitchen  PHYSICAL EXAMINATION:   Vitals:   07/04/20 1324  BP: (!) 134/47  Pulse: 60  Resp: 16  Temp: (!) 97 F (36.1 C)  SpO2: 100%   Filed Weights   07/04/20 1324  Weight: 150 lb (68 kg)    Physical Exam Constitutional:      Comments: Frail-appearing Caucasian female patient.  She is resting in a wheelchair.  Accompanied by transporter  HENT:     Head: Normocephalic and atraumatic.     Mouth/Throat:     Pharynx: No oropharyngeal exudate.  Cardiovascular:     Rate and Rhythm: Normal rate and regular rhythm.  Pulmonary:     Effort: No respiratory distress.     Breath sounds: No wheezing.     Comments: Decreased bilateral air entry. Abdominal:     General: Bowel sounds are normal. There is no distension.     Palpations: Abdomen is soft. There is no mass.     Tenderness: There is no abdominal tenderness. There is no guarding or rebound.  Musculoskeletal:        General: No tenderness. Normal range of motion.     Cervical back: Normal range of motion and neck supple.  Skin:    General: Skin is warm.  Neurological:     Mental Status: She is alert.     Comments: Oriented x2-3   Psychiatric:        Mood and Affect: Affect normal.      LABORATORY DATA:  I have reviewed the data as listed Lab Results  Component Value Date   WBC 4.0 06/21/2020   HGB 9.0 (L) 06/21/2020   HCT 27.4 (L) 06/21/2020   MCV 95.5 06/21/2020   PLT 70 (L) 06/21/2020   Recent Labs    06/21/20 1152  NA 141  K 3.9  CL 106  CO2 25  GLUCOSE 109*  BUN 45*  CREATININE 1.18*  CALCIUM 8.7*  GFRNONAA 41*  GFRAA 47*  PROT 8.3*  ALBUMIN 3.7  AST 15  ALT 9  ALKPHOS 96  BILITOT 0.7     US Abdomen Complete  Result Date: 06/29/2020 CLINICAL DATA:  Thrombocytopenia, hepatomegaly on physical exam EXAM: ABDOMEN ULTRASOUND COMPLETE COMPARISON:  09/11/2016 FINDINGS: Gallbladder: Cholelithiasis without evidence of complicating factors. No wall thickening or pericholecystic fluid is noted. Common bile duct: Diameter: 4.2 mm. Liver: No focal lesion identified. Within normal limits in parenchymal echogenicity. Portal vein is patent on color Doppler imaging with normal direction of blood flow towards the liver. IVC: No abnormality visualized. Pancreas: There is a hypoechoic lesion measuring 2.8 x 1.6 x 3.0 cm adjacent to the head and body of the pancreas. It is uncertain whether this lies within the pancreas or adjacent to the pancreas. Tail is within normal limits. Spleen: Size and appearance within normal limits. Right Kidney: Length: 9.6 cm. 7.7 cm cyst is noted in the upper pole similar to that seen on prior CT. No obstructive changes are noted. Left Kidney: Length: 9.8 cm. Echogenicity within normal limits. No mass or hydronephrosis visualized. Abdominal aorta: No aneurysm visualized. Other findings: None. IMPRESSION: Cholelithiasis without complicating factors stable from the prior exam. Hypoechoic area adjacent to the head and body of the pancreas. This may represent a focal lymph node although the possibility of an intrapancreatic mass could not be totally excluded. MRI of the pancreas is  recommended if the patient is clinically able. Alternatively a CT pancreas protocol could be performed. Right renal cyst stable from prior CT. Electronically Signed   By: Inez Catalina M.D.   On: 06/29/2020 15:04    ASSESSMENT & PLAN:   Normocytic anemia #Anemia/thrombocytopenia-normocytic ~hemoglobin 9; platelets~70; mild neutropenia ANC 1.3-the etiology is unclear. US abdomen negative for cirrhosis or splenomegaly.  In general bone marrow biopsy be recommended/see discussion below.  #Approximate 2 cm peripancreatic/pancreatic head mass [incidental on liver ultrasound]-would need a CT scan for further evaluation.  See below  #B12 deficiency-recommend B12 injections 1000 mcg monthly [to be done at the facility]  #A long discussion with the patient's healthcare provider at [facility] Ms. Ellyn Hack, NP-regarding the need for above work-up.  However discussed that above work-up needs to be recommended in the context of her multiple medical problems including dementia history of seizures advanced age frailty etc.  We will also reach out to patient's son.   # DISPOSITION: # follow up in 3 months; MD;labs- cbc/cmp/[id number]-Dr.B  Dr.Slade-Hartman, Altamese Cabal, MD 07/04/2020 2:59 PM

## 2020-07-04 NOTE — Assessment & Plan Note (Addendum)
#  Anemia/thrombocytopenia-normocytic ~hemoglobin 9; platelets~70; mild neutropenia ANC 1.3-the etiology is unclear. US abdomen negative for cirrhosis or splenomegaly.  In general bone marrow biopsy be recommended/see discussion below.  #Approximate 2 cm peripancreatic/pancreatic head mass [incidental on liver ultrasound]-would need a CT scan for further evaluation.  See below  #B12 deficiency-recommend B12 injections 1000 mcg monthly [to be done at the facility]  #A long discussion with the patient's healthcare provider at [facility] Ms. Ellyn Hack, NP-regarding the need for above work-up.  However discussed that above work-up needs to be recommended in the context of her multiple medical problems including dementia history of seizures advanced age frailty etc.  We will also reach out to patient's son.   # DISPOSITION: # follow up in 3 months; MD;labs- cbc/cmp/[id number]-Dr.B  Dr.Slade-Hartman, Ivette Loyal

## 2020-07-05 ENCOUNTER — Telehealth: Payer: Self-pay | Admitting: Internal Medicine

## 2020-07-05 NOTE — Telephone Encounter (Signed)
Spoke to patient's son Theodoro Doing regarding patient's multiple medical problems-anemia thrombocytopenia-unclear etiology-we will need bone marrow biopsy  Peripancreatic mass per ultrasound ~2 cm-recommend CT abdomen pelvis.  However given patient's multiple comorbidities/age frailty dementia-I discussed my concerns unfortunately aggressive work-up with biopsy imaging-is unfortunately not going to change the management/prognosis as patient is a poor candidate for any treatment options.  He understands; and he agrees hold off any imaging/biopsy and continue surveillance. On 9/13-also discussed with patient's provider Ms. Ellyn Hack NP at [facility].  GB

## 2020-08-25 DIAGNOSIS — E782 Mixed hyperlipidemia: Secondary | ICD-10-CM | POA: Insufficient documentation

## 2020-08-25 DIAGNOSIS — I4819 Other persistent atrial fibrillation: Secondary | ICD-10-CM | POA: Insufficient documentation

## 2020-08-25 DIAGNOSIS — I10 Essential (primary) hypertension: Secondary | ICD-10-CM | POA: Insufficient documentation

## 2020-10-04 ENCOUNTER — Inpatient Hospital Stay (HOSPITAL_BASED_OUTPATIENT_CLINIC_OR_DEPARTMENT_OTHER): Payer: Medicare Other | Admitting: Internal Medicine

## 2020-10-04 ENCOUNTER — Encounter: Payer: Self-pay | Admitting: Internal Medicine

## 2020-10-04 ENCOUNTER — Other Ambulatory Visit: Payer: Self-pay

## 2020-10-04 ENCOUNTER — Inpatient Hospital Stay: Payer: Medicare Other | Attending: Internal Medicine

## 2020-10-04 DIAGNOSIS — E538 Deficiency of other specified B group vitamins: Secondary | ICD-10-CM | POA: Diagnosis not present

## 2020-10-04 DIAGNOSIS — K869 Disease of pancreas, unspecified: Secondary | ICD-10-CM | POA: Diagnosis not present

## 2020-10-04 DIAGNOSIS — Z79899 Other long term (current) drug therapy: Secondary | ICD-10-CM | POA: Diagnosis not present

## 2020-10-04 DIAGNOSIS — F0390 Unspecified dementia without behavioral disturbance: Secondary | ICD-10-CM | POA: Diagnosis not present

## 2020-10-04 DIAGNOSIS — E039 Hypothyroidism, unspecified: Secondary | ICD-10-CM | POA: Diagnosis not present

## 2020-10-04 DIAGNOSIS — D649 Anemia, unspecified: Secondary | ICD-10-CM | POA: Insufficient documentation

## 2020-10-04 DIAGNOSIS — D696 Thrombocytopenia, unspecified: Secondary | ICD-10-CM | POA: Diagnosis not present

## 2020-10-04 DIAGNOSIS — Z87891 Personal history of nicotine dependence: Secondary | ICD-10-CM | POA: Diagnosis not present

## 2020-10-04 DIAGNOSIS — G4700 Insomnia, unspecified: Secondary | ICD-10-CM | POA: Diagnosis not present

## 2020-10-04 DIAGNOSIS — Z87442 Personal history of urinary calculi: Secondary | ICD-10-CM | POA: Insufficient documentation

## 2020-10-04 DIAGNOSIS — I4891 Unspecified atrial fibrillation: Secondary | ICD-10-CM | POA: Insufficient documentation

## 2020-10-04 DIAGNOSIS — Z7901 Long term (current) use of anticoagulants: Secondary | ICD-10-CM | POA: Insufficient documentation

## 2020-10-04 DIAGNOSIS — Z8249 Family history of ischemic heart disease and other diseases of the circulatory system: Secondary | ICD-10-CM | POA: Insufficient documentation

## 2020-10-04 DIAGNOSIS — I10 Essential (primary) hypertension: Secondary | ICD-10-CM | POA: Diagnosis not present

## 2020-10-04 LAB — COMPREHENSIVE METABOLIC PANEL
ALT: 9 U/L (ref 0–44)
AST: 14 U/L — ABNORMAL LOW (ref 15–41)
Albumin: 2.9 g/dL — ABNORMAL LOW (ref 3.5–5.0)
Alkaline Phosphatase: 148 U/L — ABNORMAL HIGH (ref 38–126)
Anion gap: 10 (ref 5–15)
BUN: 28 mg/dL — ABNORMAL HIGH (ref 8–23)
CO2: 26 mmol/L (ref 22–32)
Calcium: 9.9 mg/dL (ref 8.9–10.3)
Chloride: 102 mmol/L (ref 98–111)
Creatinine, Ser: 0.86 mg/dL (ref 0.44–1.00)
GFR, Estimated: >60 mL/min (ref >60)
Glucose, Bld: 110 mg/dL — ABNORMAL HIGH (ref 70–99)
Potassium: 3.2 mmol/L — ABNORMAL LOW (ref 3.5–5.1)
Sodium: 138 mmol/L (ref 135–145)
Total Bilirubin: 1 mg/dL (ref 0.3–1.2)
Total Protein: 7.4 g/dL (ref 6.5–8.1)

## 2020-10-04 LAB — CBC WITH DIFFERENTIAL/PLATELET
Abs Immature Granulocytes: 0.03 10*3/uL (ref 0.00–0.07)
Basophils Absolute: 0 10*3/uL (ref 0.0–0.1)
Basophils Relative: 1 %
Eosinophils Absolute: 0 10*3/uL (ref 0.0–0.5)
Eosinophils Relative: 0 %
HCT: 28.8 % — ABNORMAL LOW (ref 36.0–46.0)
Hemoglobin: 8.7 g/dL — ABNORMAL LOW (ref 12.0–15.0)
Immature Granulocytes: 2 %
Lymphocytes Relative: 31 %
Lymphs Abs: 0.6 10*3/uL — ABNORMAL LOW (ref 0.7–4.0)
MCH: 28.9 pg (ref 26.0–34.0)
MCHC: 30.2 g/dL (ref 30.0–36.0)
MCV: 95.7 fL (ref 80.0–100.0)
Monocytes Absolute: 0.6 10*3/uL (ref 0.1–1.0)
Monocytes Relative: 30 %
Neutro Abs: 0.7 10*3/uL — ABNORMAL LOW (ref 1.7–7.7)
Neutrophils Relative %: 36 %
Platelets: 62 10*3/uL — ABNORMAL LOW (ref 150–400)
RBC: 3.01 MIL/uL — ABNORMAL LOW (ref 3.87–5.11)
RDW: 16.7 % — ABNORMAL HIGH (ref 11.5–15.5)
Smear Review: NORMAL
WBC: 1.9 10*3/uL — ABNORMAL LOW (ref 4.0–10.5)
nRBC: 0 % (ref 0.0–0.2)

## 2020-10-04 NOTE — Progress Notes (Signed)
[facility] CONSULT NOTE  Patient Care Team: Arlyss Queen, MD (Inactive) as PCP - General (Family Medicine)  CHIEF COMPLAINTS/PURPOSE OF CONSULTATION: Anemia/thrombocytopenia   HEMATOLOGY HISTORY  # ANEMIA/ THROMBOCYTOPENIA [at least since 2017- Hb 11/110; AUG 2021-  WBC-3.7;ANC-1.3;Hb-9.6; MCV- 93; platelets-70]; SEP [place] - There is a hypoechoic lesion measuring 2.8 x 1.6 x 3.0 cm adjacent to the head and body of the pancreas. It is uncertain whether this lies within the pancreas or adjacent to the pancreas.  # B12 deficiency-September [id number] injections facility.Marland Kitchen   #A. fib on Eliquis; history of seizures on Keppra; peripheral neuropathy.; dementia/[facility]  HISTORY OF PRESENTING ILLNESS: POOR HISTORIAN/dementia Stefanie [name] 86 y.o.  female patient with multiple medical problems including dementia, history of seizures-with worsening anemia/thrombocytopenia is here to review the recent work-up including ultrasound.  Patient is not sure why she is in the clinic.  She denies any pain.  Denies any bleeding.  No recent hospitalizations.  Review of Systems  Unable to perform ROS: Age     MEDICAL HISTORY:  Past Medical History:  Diagnosis Date   A-fib (HCC)    Acute dermatitis    Acute eczematoid otitis externa of left ear    Acute renal failure (HCC)    Allergic rhinitis    Anemia    Herpetic vulvovaginitis    History of kidney stones    Hypertension    Hypothyroidism, adult    Insomnia, unspecified    Left nephrolithiasis    Osteoarthritis    Polyneuropathy    Seizure (HCC)    Vitamin D deficiency     SURGICAL HISTORY: Past Surgical History:  Procedure Laterality Date   none      SOCIAL HISTORY: Social History   Socioeconomic History   Marital status: Widowed    Spouse name: Not on file   Number of children: Not on file   Years of education: Not on file   Highest education level: Not on file  Occupational  History   Not on file  Tobacco Use   Smoking status: Former Smoker   Smokeless tobacco: Never Used  Substance and Sexual Activity   Alcohol use: No   Drug use: No   Sexual activity: Not on file  Other Topics Concern   Not on file  Social History Narrative   Not on file   Social Determinants of Health   Financial Resource Strain: Not on file  Food Insecurity: Not on file  Transportation Needs: Not on file  Physical Activity: Not on file  Stress: Not on file  Social Connections: Not on file  Intimate Partner Violence: Not on file    FAMILY HISTORY: Family History  Problem Relation Age of Onset   Hypertension Mother     ALLERGIES:  is allergic to naproxen and ace inhibitors.  MEDICATIONS:  Current Outpatient Medications  Medication Sig Dispense Refill   Acetaminophen 500 MG capsule Take 650 mg by mouth in the morning and at bedtime.      acyclovir (ZOVIRAX) 400 MG tablet Take 400 mg by mouth daily.     albuterol (VENTOLIN HFA) 108 (90 Base) MCG/ACT inhaler Inhale into the lungs. (Patient not taking: Reported on 06/21/2020)     apixaban (ELIQUIS) 2.5 MG TABS tablet Take by mouth. (Patient not taking: Reported on 06/21/2020)     gabapentin (NEURONTIN) 100 MG capsule Take 100 mg by mouth 2 (two) times daily.     hydrochlorothiazide (MICROZIDE) 12.5 MG capsule Take 12.5 mg  by mouth daily.     hydrocortisone 2.5 % cream Apply topically.     levETIRAcetam (KEPPRA) 500 MG tablet Take 500 mg by mouth 2 (two) times daily.     levothyroxine (SYNTHROID) 100 MCG tablet Take 1 tablet by mouth daily.     losartan (COZAAR) 100 MG tablet Take 1 tablet by mouth daily.     melatonin 5 MG TABS Take 5 mg by mouth at bedtime.     traZODone (DESYREL) 50 MG tablet Take 25 mg by mouth at bedtime as needed for sleep. (Patient not taking: Reported on 06/21/2020)     valACYclovir (VALTREX) 1000 MG tablet Take 1,000 mg by mouth 2 (two) times daily.     No current  facility-administered medications for this visit.     Marland Kitchen  PHYSICAL EXAMINATION:   Vitals:   10/04/20 1436  BP: (!) 114/50  Pulse: 60  Resp: 16  Temp: 98.3 F (36.8 C)  SpO2: 98%   Filed Weights   10/04/20 1436  Weight: 139 lb (63 kg)    Physical Exam Constitutional:      Comments: Frail-appearing Caucasian female patient.  She is resting in a wheelchair.  Accompanied by transporter  HENT:     Head: Normocephalic and atraumatic.     Mouth/Throat:     Pharynx: No oropharyngeal exudate.  Cardiovascular:     Rate and Rhythm: Normal rate and regular rhythm.  Pulmonary:     Effort: No respiratory distress.     Breath sounds: No wheezing.     Comments: Decreased bilateral air entry. Abdominal:     General: Bowel sounds are normal. There is no distension.     Palpations: Abdomen is soft. There is no mass.     Tenderness: There is no abdominal tenderness. There is no guarding or rebound.  Musculoskeletal:        General: No tenderness. Normal range of motion.     Cervical back: Normal range of motion and neck supple.  Skin:    General: Skin is warm.  Neurological:     Mental Status: She is alert.     Comments: Oriented x2-3  Psychiatric:        Mood and Affect: Affect normal.      LABORATORY DATA:  I have reviewed the data as listed Lab Results  Component Value Date   WBC 1.9 (L) 10/04/2020   HGB 8.7 (L) 10/04/2020   HCT 28.8 (L) 10/04/2020   MCV 95.7 10/04/2020   PLT 62 (L) 10/04/2020   Recent Labs    06/21/20 1152 10/04/20 1354  NA 141 138  K 3.9 3.2*  CL 106 102  CO2 25 26  GLUCOSE 109* 110*  BUN 45* 28*  CREATININE 1.18* 0.86  CALCIUM 8.7* 9.9  GFRNONAA 41* >60  GFRAA 47*  --   PROT 8.3* 7.4  ALBUMIN 3.7 2.9*  AST 15 14*  ALT 9 9  ALKPHOS 96 148*  BILITOT 0.7 1.0     No results found.  ASSESSMENT & PLAN:   Normocytic anemia #Anemia/thrombocytopenia-normocytic ~hemoglobin 8.7 ; platelets~60s; mild neutropenia ANC 0.6-patient noted to  have drop in blood counts.  Ultrasound abdomen negative for cirrhosis or splenomegaly.  Highly clinically suspicious of bone marrow problem like high-grade MDS/leukemia.  See discussion below  #Approximate 2 cm peripancreatic/pancreatic head mass [incidental on liver ultrasound]-see below  #B12 deficiency-recommend B12 injections 1000 mcg monthly [to be done at the facility]  #I had a long discussion the patient's physician  at the [facility]. Dr.Ladak regarding above concerns of a malignant process.  However given her history of dementia history of seizures advanced age and frailty I think is reasonable to hold off further work-up, as I do not think patient candidate for any systemic therapy.  I discussed this previously with patient's son Stefanie [name]; he was in agreement.  I think patient needs palliative care evaluation at at the facility; and transition to hospice if patient's clinical condition continued decline.  # DISPOSITION: # follow up TBD- Dr.B  Addendum: on 12/14- I tried to reach patient's son, Stefanie [name]-voicemail box full unable to leave a message.  Dr.Shreif Shirlee More, MD    Earna Coder, MD 10/04/2020 8:51 PM

## 2020-10-04 NOTE — Assessment & Plan Note (Addendum)
#  Anemia/thrombocytopenia-normocytic ~hemoglobin 8.7 ; platelets~60s; mild neutropenia ANC 0.6-patient noted to have drop in blood counts.  Ultrasound abdomen negative for cirrhosis or splenomegaly.  Highly clinically suspicious of bone marrow problem like high-grade MDS/leukemia.  See discussion below  #Approximate 2 cm peripancreatic/pancreatic head mass [incidental on liver ultrasound]-see below  #B12 deficiency-recommend B12 injections 1000 mcg monthly [to be done at the facility]  #I had a long discussion the patient's physician at the [facility]. Dr.Ladak regarding above concerns of a malignant process.  However given her history of dementia history of seizures advanced age and frailty I think is reasonable to hold off further work-up, as I do not think patient candidate for any systemic therapy.  I discussed this previously with patient's son Robbi Garter; he was in agreement.  I think patient needs palliative care evaluation at at the facility; and transition to hospice if patient's clinical condition continued decline.  # DISPOSITION: # follow up TBD- Dr.B  Addendum: on 12/14- I tried to reach patient's son, Wilbert-voicemail box full unable to leave a message.  Dr.Shreif Shirlee More, MD

## 2020-10-05 LAB — CANCER ANTIGEN 19-9: CA 19-9: 21 U/mL (ref 0–35)

## 2020-10-06 ENCOUNTER — Non-Acute Institutional Stay: Payer: Medicare Other | Admitting: Primary Care

## 2020-10-06 ENCOUNTER — Other Ambulatory Visit: Payer: Self-pay

## 2020-10-06 DIAGNOSIS — I4819 Other persistent atrial fibrillation: Secondary | ICD-10-CM

## 2020-10-06 DIAGNOSIS — R531 Weakness: Secondary | ICD-10-CM

## 2020-10-06 DIAGNOSIS — Z515 Encounter for palliative care: Secondary | ICD-10-CM

## 2020-10-06 DIAGNOSIS — D696 Thrombocytopenia, unspecified: Secondary | ICD-10-CM

## 2020-10-06 DIAGNOSIS — D649 Anemia, unspecified: Secondary | ICD-10-CM

## 2020-10-06 NOTE — Progress Notes (Addendum)
[facility] Consult Note Telephone: [phone]  Fax: [phone]     Date of encounter: 10/06/20 PATIENT NAME: Lindsee Labarre [place] [address] [phone] (home)  DOB: 1929-09-16 MRN: [id number]  PRIMARY CARE PROVIDER:    Gennie Alma, [place] [address] [phone]  REFERRING PROVIDER:   Gennie Alma, [place] [address] [phone]  RESPONSIBLE PARTY:   Extended Emergency Contact Information Primary Emergency Contact: Lewellen,Wilbert Address: [address] [place], [place] [address] [place] Phone: [phone] Mobile Phone: [phone] Relation: Son  I met face to face with patient and staff in the facility. Palliative Care was asked to follow this patient by consultation request of Dr Calton Dach to help address advance care planning and goals of care. This is the initial visit.  ASSESSMENT AND RECOMMENDATIONS:   1. Advance Care Planning/Goals of Care: Goals include to maximize quality of life and symptom management. DNR on file and in [place]. I reached out to her son's phone and reached him on cell line.  He outlined care goals including treating any problems in place. States he spoke with his mother some years ago and states she did not want to be artificially prolonged.We discussed venue of treatment and he elects as much support in place as possible.  Staff reports son has been reticent to choose hospice services. Since her abdominal mass diagnosis is not definitive, Palliative will continue to follow to assess for decline, Should her weight drop or she develop worsening symptoms, moving forward with hospice would be a good plan if concordant with family goals of care.  2. Symptom Management:   I met with Ms Privott in her nursing home room. She was unable to give much information due to her dementia.  She did  deny pain or other discomforts and chatted easily, chucking a few times. Staff states her intake is fair but she does not exhibit pain. Her medical course is now focused on supportive care as aggressive work ups are not advised by oncology team.   3. Follow up Palliative Care Visit: Palliative care will continue to follow for goals of care clarification and symptom management. Return 4-6 weeks or prn.  4. Family /Caregiver/Community Supports: Oldest son is [place], lives in [place]. Another son has also just come to live at same SNF.  5. Cognitive / Functional decline: A and O x 1, dependent in all adls and iadls.  I spent 35 minutes providing this consultation,  from 1300 to 1355. More than 50% of the time in this consultation was spent coordinating communication.   CODE STATUS:DNR  PPS: 40%  HOSPICE ELIGIBILITY/DIAGNOSIS: yes/adbominal mass  Subjective:  CHIEF COMPLAINT: debility  HISTORY OF PRESENT ILLNESS:  Clare Fennimore is a 86 y.o. year old female  with hepato-splenomegaly; pancytopenia, advanced dementia,functional decline She is not pursuing more diagnostics or treatments for an abdominal mass. Family care goals include supportive care.We are asked to consult around advance care planning and complex medical decision making.    Review and summarization of old Epic records shows or history from other than patient.. Review or lab tests= 2.9 albumin, creatinine 0.86 mg/dl; hbg 8.7 g/ dl,  - abdominal u/s reveals mass , 2.8 x 1.6 x 3.0 cm, hypoechoic Review of case with family member [name] Derusha  History obtained from review of EMR, discussion with primary team,  and  interview with family, caregiver  and/or Ms. Spera. Records reviewed and summarized above.   CURRENT PROBLEM LIST:  Patient Active Problem List   Diagnosis Date Noted  . Persistent atrial fibrillation ([place]) 08/25/2020  . Hyperlipidemia, mixed 08/25/2020  . Normocytic anemia 06/21/2020  . Thrombocytopenia ([place]) 06/21/2020   . Hepatomegaly 06/21/2020  . Arthritis 05/02/2020  . Palliative care encounter   . Goals of care, counseling/discussion   . Altered mental status 09/25/2016  . Generalized weakness 09/25/2016  . Chest pain 07/29/2016  . Unspecified convulsions ([place]) 10/17/2013    PAST MEDICAL HISTORY:  Active Ambulatory Problems    Diagnosis Date Noted  . Chest pain 07/29/2016  . Altered mental status 09/25/2016  . Generalized weakness 09/25/2016  . Palliative care encounter   . Goals of care, counseling/discussion   . Normocytic anemia 06/21/2020  . Thrombocytopenia ([place]) 06/21/2020  . Hepatomegaly 06/21/2020  . Arthritis 05/02/2020  . Persistent atrial fibrillation ([place]) 08/25/2020  . Hyperlipidemia, mixed 08/25/2020  . Unspecified convulsions ([place]) 10/17/2013   Resolved Ambulatory Problems    Diagnosis Date Noted  . No Resolved Ambulatory Problems   Past Medical History:  Diagnosis Date  . A-fib ([place])   . Acute dermatitis   . Acute eczematoid otitis externa of left ear   . Acute renal failure ([place])   . Allergic rhinitis   . Anemia   . Herpetic vulvovaginitis   . History of kidney stones   . Hypertension   . Hypothyroidism, adult   . Insomnia, unspecified   . Left nephrolithiasis   . Osteoarthritis   . Polyneuropathy   . Seizure ([place])   . Vitamin D deficiency     SOCIAL HX:  Social History   Tobacco Use  . Smoking status: Former Research scientist (life sciences)  . Smokeless tobacco: Never Used  Substance Use Topics  . Alcohol use: No   FAMILY HX:  Family History  Problem Relation Age of Onset  . Hypertension Mother     =i ALLERGIES:  Allergies  Allergen Reactions  . Naproxen Anaphylaxis  . Ace Inhibitors Other (See Comments)     PERTINENT MEDICATIONS:  Outpatient Encounter Medications as of 10/06/2020  Medication Sig  . Acetaminophen 500 MG capsule Take 650 mg by mouth in the morning and at bedtime.   Marland Kitchen acyclovir (ZOVIRAX) 400 MG tablet Take 400 mg by mouth daily.  Marland Kitchen albuterol  (VENTOLIN HFA) 108 (90 Base) MCG/ACT inhaler Inhale into the lungs. (Patient not taking: Reported on 06/21/2020)  . apixaban (ELIQUIS) 2.5 MG TABS tablet Take by mouth. (Patient not taking: Reported on 06/21/2020)  . gabapentin (NEURONTIN) 100 MG capsule Take 100 mg by mouth 2 (two) times daily.  . hydrochlorothiazide (MICROZIDE) 12.5 MG capsule Take 12.5 mg by mouth daily.  . hydrocortisone 2.5 % cream Apply topically.  . levETIRAcetam (KEPPRA) 500 MG tablet Take 500 mg by mouth 2 (two) times daily.  Marland Kitchen levothyroxine (SYNTHROID) 100 MCG tablet Take 1 tablet by mouth daily.  Marland Kitchen losartan (COZAAR) 100 MG tablet Take 1 tablet by mouth daily.  . melatonin 5 MG TABS Take 5 mg by mouth at bedtime.  . traZODone (DESYREL) 50 MG tablet Take 25 mg by mouth at bedtime as needed for sleep. (Patient not taking: Reported on 06/21/2020)  . valACYclovir (VALTREX) 1000 MG tablet Take 1,000 mg by mouth 2 (two) times daily.   No facility-administered encounter medications on file as of 10/06/2020.    Objective: ROS/staff  General: NAD ENMT: denies dysphagia Cardiovascular:  denies chest pain Pulmonary: denies cough, denies increased SOB Abdomen: endorses fir appetite,  endorses incontinence of bowel GU: denies dysuria, endorses incontinence of urine MSK:  endorses ROM limitations, no falls reported Skin: h/o of genital lesions presumed HSV for some time, recently found to be negative on culture for HSV Neurological: endorses weakness, denies pain, staff reports dementia Psych: Endorses positive mood Heme/lymph/immuno: denies bruises, abnormal bleeding  Physical Exam: Current and past weights:139 lbs, stable,  Recent 10 lbs loss although recorded values range from 140 to 150 lbs in the chart.  Constitutional: NAD General: frail appearing, WNWD  EYES: anicteric sclera,lids intact, no discharge  ENMT: intact hearing, oral mucous membranes moist CV:  no LE edema Pulmonary:  no increased work of breathing, no  cough, no audible wheezes, room air Abdomen: intake 50%, no ascites MSK: moderate sarcopenia, decreased ROM in all extremities, non -ambulatory, in w/c Skin: warm and dry, no rashes or wounds on visible skin, h/o genital lesions Neuro: Generalized weakness, severe cognitive impairment Psych: non-anxious affect, A and O x 1 Hem/lymph/immuno: no widespread bruising  Thank you for the opportunity to participate in the care of Ms. Canedo.  The palliative care team will continue to follow. Please call our office at [phone] if we can be of additional assistance.  Jason Coop, NP , DNP, MPH, AGPCNP-BC, ACHPN  COVID-19 PATIENT SCREENING TOOL  Person answering questions: ____________staff______ _____   1.  Is the patient or any family member in the home showing any signs or symptoms regarding respiratory infection?               Person with Symptom- __________NA_________________  a. Fever                                                                          Yes___ No___          ___________________  b. Shortness of breath                                                    Yes___ No___          ___________________ c. Cough/congestion                                       Yes___  No___         ___________________ d. Body aches/pains                                                         Yes___ No___        ____________________ e. Gastrointestinal symptoms (diarrhea, nausea)           Yes___ No___        ____________________  2. Within the past 14 days, has anyone living in the home had any  contact with someone with or under investigation for COVID-19?    Yes___ No_X_   Person __________________

## 2020-11-11 ENCOUNTER — Other Ambulatory Visit: Payer: Self-pay

## 2020-11-11 ENCOUNTER — Non-Acute Institutional Stay: Payer: Medicare Other | Admitting: Primary Care

## 2020-11-11 DIAGNOSIS — Z515 Encounter for palliative care: Secondary | ICD-10-CM

## 2020-11-11 DIAGNOSIS — R531 Weakness: Secondary | ICD-10-CM

## 2020-11-11 DIAGNOSIS — D649 Anemia, unspecified: Secondary | ICD-10-CM

## 2020-11-11 DIAGNOSIS — I4819 Other persistent atrial fibrillation: Secondary | ICD-10-CM

## 2020-11-11 NOTE — Progress Notes (Signed)
[facility] Palliative Care Consult Note Telephone: [phone]  Fax: [phone]     Date of encounter: 11/11/20 PATIENT NAME: Stefanie [name] [place] [address] [phone] (home)  DOB: 1928-10-27 MRN: [id number]  PRIMARY CARE PROVIDER:    Gennie Alma, MD,  [place] [address] [phone]  REFERRING PROVIDER:   Gennie Alma, [address] [place],  [place] [address] [phone]  RESPONSIBLE PARTY:   Extended Emergency Contact Information Primary Emergency Contact: Kanaan,Wilbert Address: [address], [place] [address] Johnnette Litter of [place] Phone: [phone] Mobile Phone: [phone] Relation: Son  I met face to face with patient in facility. Palliative Care was asked to follow this patient by consultation request of Gennie Alma, MD to help address advance care planning and goals of care. This is a follow up  visit.   ASSESSMENT AND RECOMMENDATIONS:   1. Advance Care Planning/Goals of Care: Goals include to maximize quality of life and symptom management. DNR on file T/c to POA but no answer, message left.  2. Symptom Management:   I met with Ms Salvaggio  in her nursing home room. She was sitting in a chair with her eyes closed. She was somewhat rousable but did not open her eyes although she was sitting supporting her self, not asleep. Facility staff states that they have seen decline in the last 4-6 weeks. She isn't eating as much and she's sleeping more and has become less interactive. She has lost 9% weight in several months' time. I reached out to call her son [name]. No answer on his cell so  left a message to please return call. We will continue to monitor for decline and potential Hospice eligibility.    3. Follow up Palliative Care Visit: Palliative care will continue to follow for goals of care clarification and symptom management. Return 4 weeks  or prn.  4. Family /Caregiver/Community Supports: Son is POA, lives in [place].  5. Cognitive / Functional decline: lethargic, dependent in all adls.  I spent 25 minutes providing this consultation,  from 1145 to 1210. More than 50% of the time in this consultation was spent in counseling and care coordination.  CODE STATUS: DNR  PPS: 30%  HOSPICE ELIGIBILITY/DIAGNOSIS: TBD   Subjective:  CHIEF COMPLAINT: debility  HISTORY OF PRESENT ILLNESS:  Stefanie [name] is a 87 y.o. year old female  with  abdominal mass, weight loss, FTT, debility. Today she is lethargic and minimally responsive. Staff states increasingly withdrawn and has not been eating well.   We are asked to consult around advance care planning and complex medical decision making.    Review and summarization of old 31 records shows or history from other than patient Review or lab tests, radiology,  or medicine albumin 10/04/20= 2.9  History obtained from review of EMR, discussion with primary team, and  interview with family, caregiver  and/or Ms. Marro. Records reviewed and summarized above.   CURRENT PROBLEM LIST:  Patient Active Problem List   Diagnosis Date Noted  . Persistent atrial fibrillation ([place]) 08/25/2020  . Hyperlipidemia, mixed 08/25/2020  . Benign essential HTN 08/25/2020  . Normocytic anemia 06/21/2020  . Thrombocytopenia ([place]) 06/21/2020  . Hepatomegaly 06/21/2020  . Arthritis 05/02/2020  . Palliative care encounter   . Goals of care, counseling/discussion   . Altered mental status 09/25/2016  . Generalized weakness 09/25/2016  . Chest pain 07/29/2016  .  Unspecified convulsions ([place]) 10/17/2013   PAST MEDICAL HISTORY:  Active Ambulatory Problems    Diagnosis Date Noted  . Chest pain 07/29/2016  . Altered mental status 09/25/2016  . Generalized weakness 09/25/2016  . Palliative care encounter   . Goals of care, counseling/discussion   . Normocytic anemia 06/21/2020  . Thrombocytopenia ([place])  06/21/2020  . Hepatomegaly 06/21/2020  . Arthritis 05/02/2020  . Persistent atrial fibrillation ([place]) 08/25/2020  . Hyperlipidemia, mixed 08/25/2020  . Unspecified convulsions ([place]) 10/17/2013  . Benign essential HTN 08/25/2020   Resolved Ambulatory Problems    Diagnosis Date Noted  . No Resolved Ambulatory Problems   Past Medical History:  Diagnosis Date  . A-fib ([place])   . Acute dermatitis   . Acute eczematoid otitis externa of left ear   . Acute renal failure ([place])   . Allergic rhinitis   . Anemia   . Herpetic vulvovaginitis   . History of kidney stones   . Hypertension   . Hypothyroidism, adult   . Insomnia, unspecified   . Left nephrolithiasis   . Osteoarthritis   . Polyneuropathy   . Seizure ([place])   . Vitamin D deficiency    SOCIAL HX:  Social History   Tobacco Use  . Smoking status: Former Research scientist (life sciences)  . Smokeless tobacco: Never Used  Substance Use Topics  . Alcohol use: No   FAMILY HX:  Family History  Problem Relation Age of Onset  . Hypertension Mother        ALLERGIES:  Allergies  Allergen Reactions  . Naproxen Anaphylaxis  . Ace Inhibitors Other (See Comments)     PERTINENT MEDICATIONS:  Outpatient Encounter Medications as of 11/11/2020  Medication Sig  . acetaminophen (TYLENOL) 325 MG tablet Take 650 mg by mouth every 6 (six) hours as needed.  . Acetaminophen 500 MG capsule Take 500 mg by mouth in the morning and at bedtime.  Marland Kitchen acyclovir (ZOVIRAX) 400 MG tablet Take 400 mg by mouth daily.  . Cholecalciferol (VITAMIN D3 SUPER STRENGTH) 50 MCG (2000 UT) TABS Take 2,000 Units by mouth daily.  . cyanocobalamin (,VITAMIN B-12,) 1000 MCG/ML injection Inject 1,000 mcg into the muscle every 30 (thirty) days. Note date and site.  . gabapentin (NEURONTIN) 100 MG capsule Take 100 mg by mouth 2 (two) times daily.  . hydrocortisone cream 0.5 % Apply 1 application topically 2 (two) times daily as needed (rash).  . lactobacillus acidophilus (BACID) TABS tablet  Take 1 tablet by mouth 2 (two) times daily.  Marland Kitchen levETIRAcetam (KEPPRA) 500 MG tablet Take 500 mg by mouth 2 (two) times daily.  Marland Kitchen levothyroxine (SYNTHROID) 100 MCG tablet Take 1 tablet by mouth daily.  Marland Kitchen lidocaine (XYLOCAINE) 2 % jelly Apply 1 application topically as needed (to groin and back).  . melatonin 5 MG TABS Take 5 mg by mouth at bedtime.  . Triamcinolone Acetonide (TRIAMCINOLONE 0.1 % CREAM : EUCERIN) CREA Apply 1 application topically 2 (two) times daily. To behind left ear as needed for eczema.  . white petrolatum (VASELINE) GEL Apply 1 application topically at bedtime. Apply liberally every night at bedtime to vulvar lesions.  . hydrochlorothiazide (MICROZIDE) 12.5 MG capsule Take 12.5 mg by mouth daily. (Patient not taking: Reported on 10/06/2020)  . [DISCONTINUED] albuterol (VENTOLIN HFA) 108 (90 Base) MCG/ACT inhaler Inhale into the lungs. (Patient not taking: No sig reported)  . [DISCONTINUED] apixaban (ELIQUIS) 2.5 MG TABS tablet Take by mouth. (Patient not taking: No sig reported)  . [DISCONTINUED] losartan (COZAAR) 100  MG tablet Take 1 tablet by mouth daily. (Patient not taking: Reported on 10/06/2020)  . [DISCONTINUED] traZODone (DESYREL) 50 MG tablet Take 25 mg by mouth at bedtime as needed for sleep. (Patient not taking: No sig reported)  . [DISCONTINUED] valACYclovir (VALTREX) 1000 MG tablet Take 1,000 mg by mouth 2 (two) times daily. (Patient not taking: Reported on 10/06/2020)   No facility-administered encounter medications on file as of 11/11/2020.    Objective: ROS/staff report  General: NAD EYES:  wears glasses ENMT: denies dysphagia Pulmonary: denies  cough, denies increased SOB Abdomen: endorses fair to poor appetite, denies constipation, endorses incontinence of bowel GU: denies dysuria, endorses incontinence of urine MSK:  endorses ROM limitations, no falls reported Skin: denies rashes or wounds Neurological: endorses weakness, denies pain, denies  insomnia Psych: Endorses flat mood Heme/lymph/immuno: denies bruises, abnormal bleeding  Physical Exam: Current and past weights: Current Jan 22 = 136.2, loss of 3 lbs in a month, 15 lbs in 4 mos, 9% weight loss Constitutional: lethargic, NAD General: frail appearing, thin  EYES: anicteric sclera, lids intact, + bil eye discharge  ENMT: intact hearing,oral mucous membranes moist CV: S1S2, RRR, no LE edema Pulmonary:  no increased work of breathing, no cough, no audible wheezes, room air Abdomen: intake 50%, no ascites GU: deferred MSK: moderate sarcopenia, decreased ROM in all extremities, no contractures of LE, non ambulatory Skin: warm and dry, no rashes or wounds on visible skin Neuro: Generalized weakness,++ cognitive impairment Psych: lethargic affect,  Hem/lymph/immuno: no widespread bruising  Thank you for the opportunity to participate in the care of Ms. Fromme.  The palliative care team will continue to follow. Please call our office at [phone] if we can be of additional assistance.  Jason Coop, NP , DNP, MPH, AGPCNP-BC, ACHPN  COVID-19 PATIENT SCREENING TOOL  Person answering questions: ____________staff______ _____   1.  Is the patient or any family member in the home showing any signs or symptoms regarding respiratory infection?               Person with Symptom- __________NA_________________  a. Fever                                                                          Yes___ No___          ___________________  b. Shortness of breath                                                    Yes___ No___          ___________________ c. Cough/congestion                                       Yes___  No___         ___________________ d. Body aches/pains  Yes___ No___        ____________________ e. Gastrointestinal symptoms (diarrhea, nausea)           Yes___ No___        ____________________  2. Within  the past 14 days, has anyone living in the home had any contact with someone with or under investigation for COVID-19?    Yes___ No_X_   Person __________________

## 2020-11-22 DEATH — deceased

## 2020-11-25 ENCOUNTER — Telehealth: Payer: Self-pay | Admitting: Primary Care

## 2020-11-25 ENCOUNTER — Encounter: Payer: Self-pay | Admitting: Primary Care

## 2020-11-25 NOTE — Telephone Encounter (Signed)
Patient passed away on 11-26-20. Nursing home apprised this Clinical research associate.
